# Patient Record
Sex: Male | Born: 1970 | Race: White | Hispanic: No | Marital: Married | Smoking: Never smoker
Health system: Southern US, Community
[De-identification: ages and names within clinical notes are randomized; demographics above are authoritative.]

## PROBLEM LIST (undated history)

## (undated) DIAGNOSIS — E785 Hyperlipidemia, unspecified: Secondary | ICD-10-CM

## (undated) DIAGNOSIS — K219 Gastro-esophageal reflux disease without esophagitis: Secondary | ICD-10-CM

## (undated) DIAGNOSIS — T7840XA Allergy, unspecified, initial encounter: Secondary | ICD-10-CM

## (undated) DIAGNOSIS — I10 Essential (primary) hypertension: Secondary | ICD-10-CM

## (undated) DIAGNOSIS — R51 Headache: Secondary | ICD-10-CM

## (undated) HISTORY — DX: Headache: R51

## (undated) HISTORY — DX: Hyperlipidemia, unspecified: E78.5

## (undated) HISTORY — DX: Essential (primary) hypertension: I10

## (undated) HISTORY — DX: Allergy, unspecified, initial encounter: T78.40XA

## (undated) HISTORY — DX: Gastro-esophageal reflux disease without esophagitis: K21.9

---

## 2000-01-23 ENCOUNTER — Encounter: Payer: Self-pay | Admitting: Internal Medicine

## 2000-01-23 ENCOUNTER — Ambulatory Visit (HOSPITAL_COMMUNITY): Admission: RE | Admit: 2000-01-23 | Discharge: 2000-01-23 | Payer: Self-pay | Admitting: Internal Medicine

## 2004-03-17 ENCOUNTER — Ambulatory Visit: Payer: Self-pay | Admitting: Internal Medicine

## 2004-04-10 ENCOUNTER — Ambulatory Visit: Payer: Self-pay | Admitting: Internal Medicine

## 2004-05-11 ENCOUNTER — Ambulatory Visit: Payer: Self-pay | Admitting: Internal Medicine

## 2004-07-15 ENCOUNTER — Ambulatory Visit: Payer: Self-pay | Admitting: Internal Medicine

## 2004-08-10 ENCOUNTER — Ambulatory Visit: Payer: Self-pay | Admitting: Internal Medicine

## 2004-08-26 ENCOUNTER — Ambulatory Visit: Payer: Self-pay | Admitting: Family Medicine

## 2004-10-14 ENCOUNTER — Ambulatory Visit: Payer: Self-pay | Admitting: Internal Medicine

## 2004-12-12 ENCOUNTER — Emergency Department (HOSPITAL_COMMUNITY): Admission: EM | Admit: 2004-12-12 | Discharge: 2004-12-12 | Payer: Self-pay | Admitting: Emergency Medicine

## 2004-12-14 ENCOUNTER — Ambulatory Visit: Payer: Self-pay | Admitting: Internal Medicine

## 2005-02-08 ENCOUNTER — Ambulatory Visit: Payer: Self-pay | Admitting: Internal Medicine

## 2005-04-12 ENCOUNTER — Ambulatory Visit: Payer: Self-pay | Admitting: Internal Medicine

## 2005-07-13 ENCOUNTER — Ambulatory Visit: Payer: Self-pay | Admitting: Internal Medicine

## 2005-10-12 ENCOUNTER — Ambulatory Visit: Payer: Self-pay | Admitting: Internal Medicine

## 2006-02-03 ENCOUNTER — Ambulatory Visit: Payer: Self-pay | Admitting: Internal Medicine

## 2006-02-16 ENCOUNTER — Ambulatory Visit: Payer: Self-pay | Admitting: Internal Medicine

## 2006-04-19 ENCOUNTER — Ambulatory Visit: Payer: Self-pay | Admitting: Internal Medicine

## 2006-07-13 ENCOUNTER — Encounter: Admission: RE | Admit: 2006-07-13 | Discharge: 2006-07-13 | Payer: Self-pay | Admitting: Internal Medicine

## 2006-11-17 DIAGNOSIS — R519 Headache, unspecified: Secondary | ICD-10-CM | POA: Insufficient documentation

## 2006-11-17 DIAGNOSIS — E785 Hyperlipidemia, unspecified: Secondary | ICD-10-CM | POA: Insufficient documentation

## 2006-11-17 DIAGNOSIS — R51 Headache: Secondary | ICD-10-CM | POA: Insufficient documentation

## 2006-11-17 DIAGNOSIS — I10 Essential (primary) hypertension: Secondary | ICD-10-CM | POA: Insufficient documentation

## 2006-11-17 DIAGNOSIS — K219 Gastro-esophageal reflux disease without esophagitis: Secondary | ICD-10-CM | POA: Insufficient documentation

## 2006-12-09 ENCOUNTER — Ambulatory Visit: Payer: Self-pay | Admitting: Internal Medicine

## 2006-12-09 DIAGNOSIS — J209 Acute bronchitis, unspecified: Secondary | ICD-10-CM | POA: Insufficient documentation

## 2007-05-08 ENCOUNTER — Ambulatory Visit: Payer: Self-pay | Admitting: Internal Medicine

## 2007-05-08 DIAGNOSIS — J0110 Acute frontal sinusitis, unspecified: Secondary | ICD-10-CM | POA: Insufficient documentation

## 2007-05-08 DIAGNOSIS — M549 Dorsalgia, unspecified: Secondary | ICD-10-CM | POA: Insufficient documentation

## 2007-07-27 ENCOUNTER — Ambulatory Visit: Payer: Self-pay | Admitting: Internal Medicine

## 2007-07-27 LAB — CONVERTED CEMR LAB
ALT: 50 units/L (ref 0–53)
AST: 35 units/L (ref 0–37)
Albumin: 4.2 g/dL (ref 3.5–5.2)
Alkaline Phosphatase: 60 units/L (ref 39–117)
BUN: 20 mg/dL (ref 6–23)
Basophils Absolute: 0 10*3/uL (ref 0.0–0.1)
Basophils Relative: 0.1 % (ref 0.0–1.0)
Bilirubin Urine: NEGATIVE
Bilirubin, Direct: 0.1 mg/dL (ref 0.0–0.3)
Blood in Urine, dipstick: NEGATIVE
CO2: 30 meq/L (ref 19–32)
Calcium: 9.8 mg/dL (ref 8.4–10.5)
Chloride: 104 meq/L (ref 96–112)
Cholesterol: 325 mg/dL (ref 0–200)
Creatinine, Ser: 1.1 mg/dL (ref 0.4–1.5)
Direct LDL: 236.1 mg/dL
Eosinophils Absolute: 0.1 10*3/uL (ref 0.0–0.7)
Eosinophils Relative: 2 % (ref 0.0–5.0)
GFR calc Af Amer: 97 mL/min
GFR calc non Af Amer: 81 mL/min
Glucose, Bld: 93 mg/dL (ref 70–99)
Glucose, Urine, Semiquant: NEGATIVE
HCT: 46.8 % (ref 39.0–52.0)
HDL: 38.7 mg/dL — ABNORMAL LOW (ref >39.0)
Hemoglobin: 15.8 g/dL (ref 13.0–17.0)
Ketones, urine, test strip: NEGATIVE
Lymphocytes Relative: 27.6 % (ref 12.0–46.0)
MCHC: 33.7 g/dL (ref 30.0–36.0)
MCV: 85.6 fL (ref 78.0–100.0)
Monocytes Absolute: 0.6 10*3/uL (ref 0.1–1.0)
Monocytes Relative: 13.1 % — ABNORMAL HIGH (ref 3.0–12.0)
Neutro Abs: 2.6 10*3/uL (ref 1.4–7.7)
Neutrophils Relative %: 57.2 % (ref 43.0–77.0)
Nitrite: NEGATIVE
Platelets: 224 10*3/uL (ref 150–400)
Potassium: 4.5 meq/L (ref 3.5–5.1)
RBC: 5.47 M/uL (ref 4.22–5.81)
RDW: 12.3 % (ref 11.5–14.6)
Sodium: 139 meq/L (ref 135–145)
Specific Gravity, Urine: 1.015
TSH: 1.27 microintl units/mL (ref 0.35–5.50)
Total Bilirubin: 1 mg/dL (ref 0.3–1.2)
Total CHOL/HDL Ratio: 8.4
Total Protein: 7.8 g/dL (ref 6.0–8.3)
Triglycerides: 235 mg/dL (ref 0–149)
Urobilinogen, UA: 0.2
VLDL: 47 mg/dL — ABNORMAL HIGH (ref 0–40)
WBC Urine, dipstick: NEGATIVE
WBC: 4.5 10*3/uL (ref 4.5–10.5)
pH: 6

## 2007-08-07 ENCOUNTER — Ambulatory Visit: Payer: Self-pay | Admitting: Internal Medicine

## 2007-10-19 ENCOUNTER — Ambulatory Visit: Payer: Self-pay | Admitting: Internal Medicine

## 2007-11-06 ENCOUNTER — Ambulatory Visit: Payer: Self-pay | Admitting: Internal Medicine

## 2007-11-09 LAB — CONVERTED CEMR LAB
ALT: 58 units/L — ABNORMAL HIGH (ref 0–53)
AST: 31 units/L (ref 0–37)
Albumin: 4.2 g/dL (ref 3.5–5.2)
Alkaline Phosphatase: 57 units/L (ref 39–117)
Bilirubin, Direct: 0.1 mg/dL (ref 0.0–0.3)
Cholesterol: 257 mg/dL (ref 0–200)
Direct LDL: 183.6 mg/dL
HDL: 40.3 mg/dL (ref >39.0)
Total Bilirubin: 0.8 mg/dL (ref 0.3–1.2)
Total CHOL/HDL Ratio: 6.4
Total Protein: 7.3 g/dL (ref 6.0–8.3)
Triglycerides: 189 mg/dL — ABNORMAL HIGH (ref 0–149)
VLDL: 38 mg/dL (ref 0–40)

## 2008-02-25 ENCOUNTER — Emergency Department (HOSPITAL_BASED_OUTPATIENT_CLINIC_OR_DEPARTMENT_OTHER): Admission: EM | Admit: 2008-02-25 | Discharge: 2008-02-25 | Payer: Self-pay | Admitting: Emergency Medicine

## 2009-01-28 ENCOUNTER — Telehealth: Payer: Self-pay | Admitting: *Deleted

## 2009-01-29 ENCOUNTER — Ambulatory Visit: Payer: Self-pay | Admitting: Internal Medicine

## 2009-01-29 DIAGNOSIS — M1710 Unilateral primary osteoarthritis, unspecified knee: Secondary | ICD-10-CM | POA: Insufficient documentation

## 2009-05-21 ENCOUNTER — Telehealth: Payer: Self-pay | Admitting: Internal Medicine

## 2010-05-07 NOTE — Progress Notes (Signed)
  Phone Note Call from Patient   Summary of Call: pt called c/o flu like signs andsince last night- daughter test positive for flu today- chils,fever, myalgia- per dr Lovell Sheehan tamiflu 75 two times a day for 7 days and teraflu otc- pt informed and  meds sent in Initial call taken by: Willy Eddy, LPN,  May 21, 2009 1:22 PM    New/Updated Medications: TAMIFLU 75 MG CAPS (OSELTAMIVIR PHOSPHATE) 1 two times a day for 7 days Prescriptions: TAMIFLU 75 MG CAPS (OSELTAMIVIR PHOSPHATE) 1 two times a day for 7 days  #14 x 0   Entered by:   Willy Eddy, LPN   Authorized by:   Stacie Glaze MD   Signed by:   Willy Eddy, LPN on 81/19/1478   Method used:   Electronically to        [facility]  [place] [address] (retail)       [address] [place] [address]       [place], [place]  [address]       Ph: [phone] or [phone]       Fax: [phone]   RxID:   [id number]

## 2010-06-24 ENCOUNTER — Other Ambulatory Visit: Payer: Self-pay | Admitting: *Deleted

## 2010-06-24 MED ORDER — ATORVASTATIN CALCIUM 20 MG PO TABS: 20.0000 mg | ORAL_TABLET | Freq: Every day | ORAL | Status: DC

## 2010-10-09 ENCOUNTER — Ambulatory Visit (INDEPENDENT_AMBULATORY_CARE_PROVIDER_SITE_OTHER): Payer: 59 | Admitting: Internal Medicine

## 2010-10-09 ENCOUNTER — Encounter: Payer: Self-pay | Admitting: Internal Medicine

## 2010-10-09 VITALS — BP 140/90 | HR 84 | Temp 98.2°F | Resp 16 | Ht 70.0 in | Wt 214.0 lb

## 2010-10-09 DIAGNOSIS — L578 Other skin changes due to chronic exposure to nonionizing radiation: Secondary | ICD-10-CM

## 2010-10-09 DIAGNOSIS — L570 Actinic keratosis: Secondary | ICD-10-CM

## 2010-10-09 NOTE — Progress Notes (Signed)
  Subjective:    Patient ID: Michael Hays, male    DOB: [birth year], 40 y.o.   MRN: [id number]  HPI  Patient presents with a sore raise area on his scalp that appears to be an actinic keratoses  Review of Systems  Constitutional: Negative for fever and fatigue.  HENT: Negative for hearing loss, congestion, neck pain and postnasal drip.   Eyes: Negative for discharge, redness and visual disturbance.  Respiratory: Negative for cough, shortness of breath and wheezing.   Cardiovascular: Negative for leg swelling.  Gastrointestinal: Negative for abdominal pain, constipation and abdominal distention.  Genitourinary: Negative for urgency and frequency.  Musculoskeletal: Negative for joint swelling and arthralgias.  Skin: Negative for color change and rash.  Neurological: Negative for weakness and light-headedness.  Hematological: Negative for adenopathy.  Psychiatric/Behavioral: Negative for behavioral problems.       Objective:   Physical Exam  Constitutional: He appears well-developed and well-nourished.  HENT:  Head: Normocephalic and atraumatic.  Eyes: Conjunctivae are normal. Pupils are equal, round, and reactive to light.  Neck: Normal range of motion. Neck supple.  Cardiovascular: Normal rate and regular rhythm.   Pulmonary/Chest: Effort normal and breath sounds normal.  Abdominal: Soft. Bowel sounds are normal.  Skin:       3 cm actinic keratoses          Assessment & Plan:   Informed consent was obtained in the lesion was treated for 60 seconds of liquid nitrogen application the patient tolerated the procedure well as procedural care was discussed with the patient and instructions should the lesion reappears contact our office immediately

## 2010-10-09 NOTE — Patient Instructions (Signed)
The site we treated with cryotherapy  with scab over and heal

## 2010-12-17 ENCOUNTER — Other Ambulatory Visit: Payer: Self-pay | Admitting: Internal Medicine

## 2011-01-06 LAB — BASIC METABOLIC PANEL
CO2: 26
Calcium: 10.1
Creatinine, Ser: 1.2
GFR calc Af Amer: >60
GFR calc non Af Amer: >60
Glucose, Bld: 115 — ABNORMAL HIGH
Sodium: 139

## 2011-01-06 LAB — URINALYSIS, ROUTINE W REFLEX MICROSCOPIC
Bilirubin Urine: NEGATIVE
Nitrite: NEGATIVE
Specific Gravity, Urine: 1.021
pH: 6

## 2011-01-06 LAB — URINE MICROSCOPIC-ADD ON

## 2011-01-08 ENCOUNTER — Other Ambulatory Visit (INDEPENDENT_AMBULATORY_CARE_PROVIDER_SITE_OTHER): Payer: 59

## 2011-01-08 DIAGNOSIS — Z0000 Encounter for general adult medical examination without abnormal findings: Secondary | ICD-10-CM

## 2011-01-08 LAB — CBC WITH DIFFERENTIAL/PLATELET
Basophils Relative: 0.6 % (ref 0.0–3.0)
Eosinophils Relative: 1.7 % (ref 0.0–5.0)
Lymphocytes Relative: 21.5 % (ref 12.0–46.0)
Monocytes Absolute: 0.8 10*3/uL (ref 0.1–1.0)
Monocytes Relative: 12.5 % — ABNORMAL HIGH (ref 3.0–12.0)
Neutrophils Relative %: 63.7 % (ref 43.0–77.0)
Platelets: 212 10*3/uL (ref 150.0–400.0)
RBC: 5.09 Mil/uL (ref 4.22–5.81)
WBC: 6.1 10*3/uL (ref 4.5–10.5)

## 2011-01-08 LAB — HEPATIC FUNCTION PANEL
ALT: 51 U/L (ref 0–53)
AST: 32 U/L (ref 0–37)
Alkaline Phosphatase: 67 U/L (ref 39–117)
Bilirubin, Direct: 0 mg/dL (ref 0.0–0.3)
Total Bilirubin: 0.5 mg/dL (ref 0.3–1.2)
Total Protein: 8 g/dL (ref 6.0–8.3)

## 2011-01-08 LAB — BASIC METABOLIC PANEL
BUN: 17 mg/dL (ref 6–23)
Calcium: 9.6 mg/dL (ref 8.4–10.5)
Chloride: 103 mEq/L (ref 96–112)
Creatinine, Ser: 1 mg/dL (ref 0.4–1.5)
GFR: 86.89 mL/min (ref >60.00)

## 2011-01-08 LAB — POCT URINALYSIS DIPSTICK
Bilirubin, UA: NEGATIVE
Leukocytes, UA: NEGATIVE
Nitrite, UA: NEGATIVE
pH, UA: 6

## 2011-01-08 LAB — TSH: TSH: 1.36 u[IU]/mL (ref 0.35–5.50)

## 2011-01-08 LAB — LIPID PANEL
Total CHOL/HDL Ratio: 5
Triglycerides: 300 mg/dL — ABNORMAL HIGH (ref 0.0–149.0)

## 2011-01-15 ENCOUNTER — Encounter: Payer: Self-pay | Admitting: Internal Medicine

## 2011-01-15 ENCOUNTER — Ambulatory Visit (INDEPENDENT_AMBULATORY_CARE_PROVIDER_SITE_OTHER): Payer: 59 | Admitting: Internal Medicine

## 2011-01-15 VITALS — BP 180/120 | HR 80 | Temp 98.2°F | Resp 16 | Ht 70.0 in | Wt 216.0 lb

## 2011-01-15 DIAGNOSIS — E785 Hyperlipidemia, unspecified: Secondary | ICD-10-CM

## 2011-01-15 DIAGNOSIS — T887XXA Unspecified adverse effect of drug or medicament, initial encounter: Secondary | ICD-10-CM

## 2011-01-15 DIAGNOSIS — Z0000 Encounter for general adult medical examination without abnormal findings: Secondary | ICD-10-CM

## 2011-01-15 NOTE — Progress Notes (Signed)
  Subjective:    Patient ID: Michael Hays, male    DOB: [birth year], 40 y.o.   MRN: [id number]  HPI Ate a high salt meal last night blood pressure elevated He has gained weight and does not do aerobic exercise.  His use of beer is probably over our recommended amount which influences both his hypertension and his hyperlipidemia.  He has significant cardiovascular risk on both sides of his family     Review of Systems  Constitutional: Negative for fever and fatigue.  HENT: Negative for hearing loss, congestion, neck pain and postnasal drip.   Eyes: Negative for discharge, redness and visual disturbance.  Respiratory: Negative for cough, shortness of breath and wheezing.   Cardiovascular: Negative for leg swelling.  Gastrointestinal: Negative for abdominal pain, constipation and abdominal distention.  Genitourinary: Negative for urgency and frequency.  Musculoskeletal: Negative for joint swelling and arthralgias.  Skin: Negative for color change and rash.  Neurological: Negative for weakness and light-headedness.  Hematological: Negative for adenopathy.  Psychiatric/Behavioral: Negative for behavioral problems.   Past Medical History  Diagnosis Date  . Hyperlipidemia   . Hypertension   . GERD (gastroesophageal reflux disease)   . Allergy   . Headache    No past surgical history on file.  reports that he has never smoked. His smokeless tobacco use includes Chew. He reports that he drinks about 3.5 ounces of alcohol per week. His drug history not on file. family history includes Coronary artery disease in an unspecified family member; Diabetes in his mother; and Early death (age of onset:61) in his mother. Allergies  Allergen Reactions  . Atenolol   . Codeine   . Hydrocodone-Acetaminophen         Objective:   Physical Exam  Nursing note and vitals reviewed. Constitutional: He is oriented to person, place, and time. He appears well-developed and well-nourished.  HENT:    Head: Normocephalic and atraumatic.  Eyes: Conjunctivae are normal. Pupils are equal, round, and reactive to light.  Neck: Normal range of motion. Neck supple.  Cardiovascular: Normal rate and regular rhythm.   Pulmonary/Chest: Effort normal and breath sounds normal.  Abdominal: Soft. Bowel sounds are normal.  Musculoskeletal: Normal range of motion.  Neurological: He is alert and oriented to person, place, and time.  Skin: Skin is warm and dry.          Assessment & Plan:   Patient presents for yearly preventative medicine examination.   all immunizations and health maintenance protocols were reviewed with the patient and they are up to date with these protocols.   screening laboratory values were reviewed with the patient including screening of hyperlipidemia PSA renal function and hepatic function.   There medications past medical history social history problem list and allergies were reviewed in detail.   Goals were established with regard to weight loss exercise diet in compliance with medications  Weight loss compliance with medications moderation of diet are all she's to sustaining his health current medications will be maintained with a goal of 20 pound weight loss and that aerobic exercise program

## 2011-01-15 NOTE — Patient Instructions (Signed)
Under 200 pounds weight is goal

## 2011-06-21 ENCOUNTER — Other Ambulatory Visit: Payer: Self-pay | Admitting: Internal Medicine

## 2011-07-06 ENCOUNTER — Other Ambulatory Visit: Payer: Self-pay | Admitting: *Deleted

## 2011-07-06 MED ORDER — AZITHROMYCIN 250 MG PO TABS: 250.0000 mg | ORAL_TABLET | Freq: Every day | ORAL | Status: AC

## 2011-07-16 ENCOUNTER — Other Ambulatory Visit: Payer: 59

## 2011-07-23 ENCOUNTER — Ambulatory Visit: Payer: 59 | Admitting: Internal Medicine

## 2011-07-26 ENCOUNTER — Other Ambulatory Visit (INDEPENDENT_AMBULATORY_CARE_PROVIDER_SITE_OTHER): Payer: 59

## 2011-07-26 DIAGNOSIS — T887XXA Unspecified adverse effect of drug or medicament, initial encounter: Secondary | ICD-10-CM

## 2011-07-26 DIAGNOSIS — E785 Hyperlipidemia, unspecified: Secondary | ICD-10-CM

## 2011-07-26 LAB — LIPID PANEL
Cholesterol: 259 mg/dL — ABNORMAL HIGH (ref 0–200)
Total CHOL/HDL Ratio: 4

## 2011-07-26 LAB — HEPATIC FUNCTION PANEL
ALT: 45 U/L (ref 0–53)
AST: 30 U/L (ref 0–37)
Albumin: 4.6 g/dL (ref 3.5–5.2)
Alkaline Phosphatase: 58 U/L (ref 39–117)
Total Bilirubin: 0.7 mg/dL (ref 0.3–1.2)

## 2011-07-30 ENCOUNTER — Ambulatory Visit (INDEPENDENT_AMBULATORY_CARE_PROVIDER_SITE_OTHER): Payer: 59 | Admitting: Internal Medicine

## 2011-07-30 ENCOUNTER — Encounter: Payer: Self-pay | Admitting: Internal Medicine

## 2011-07-30 VITALS — BP 160/90 | HR 80 | Temp 98.2°F | Resp 16 | Ht 70.0 in | Wt 209.0 lb

## 2011-07-30 DIAGNOSIS — E785 Hyperlipidemia, unspecified: Secondary | ICD-10-CM

## 2011-07-30 DIAGNOSIS — I10 Essential (primary) hypertension: Secondary | ICD-10-CM

## 2011-07-30 MED ORDER — ATORVASTATIN CALCIUM 40 MG PO TABS: 40.0000 mg | ORAL_TABLET | Freq: Every day | ORAL | Status: DC

## 2011-07-30 MED ORDER — HYDROCHLOROTHIAZIDE 12.5 MG PO TABS: 12.5000 mg | ORAL_TABLET | Freq: Every day | ORAL | Status: DC

## 2011-07-30 NOTE — Progress Notes (Signed)
Subjective:    Patient ID: Michael Hays, male    DOB: 1970-05-23, 41 y.o.   MRN: [id number]  HPI Increased pressure and stress. Also increased beer. Blood pressure increased at home 150/80 Pt states that sometime blood pressure is lower such as on a Sunday morning it is 107/80 Also timing of medications plays a role Only on Nadolol The nadolol helps the head aches Discussion of the used of a diuretic in the day Lipid monitoring     Review of Systems  Constitutional: Negative for fever and fatigue.  HENT: Negative for hearing loss, congestion, neck pain and postnasal drip.   Eyes: Negative for discharge, redness and visual disturbance.  Respiratory: Negative for cough, shortness of breath and wheezing.   Cardiovascular: Negative for leg swelling.  Gastrointestinal: Negative for abdominal pain, constipation and abdominal distention.  Genitourinary: Negative for urgency and frequency.  Musculoskeletal: Negative for joint swelling and arthralgias.  Skin: Negative for color change and rash.  Neurological: Negative for weakness and light-headedness.  Hematological: Negative for adenopathy.  Psychiatric/Behavioral: Negative for behavioral problems.   Past Medical History  Diagnosis Date  . Hyperlipidemia   . Hypertension   . GERD (gastroesophageal reflux disease)   . Allergy   . Headache     History   Social History  . Marital Status: Married    Spouse Name: N/A    Number of Children: N/A  . Years of Education: N/A   Occupational History  . carpenter    Social History Main Topics  . Smoking status: Never Smoker   . Smokeless tobacco: Current User    Types: Chew  . Alcohol Use: 3.5 oz/week    7 drink(s) per week  . Drug Use: Not on file  . Sexually Active: Yes   Other Topics Concern  . Not on file   Social History Narrative  . No narrative on file    No past surgical history on file.  Family History  Problem Relation Age of Onset  . Coronary artery  disease    . Diabetes Mother   . Early death Mother 2    Allergies  Allergen Reactions  . Atenolol   . Codeine   . Hydrocodone-Acetaminophen     Current Outpatient Prescriptions on File Prior to Visit  Medication Sig Dispense Refill  . aspirin 325 MG tablet Take 325 mg by mouth daily.        . fexofenadine (ALLEGRA) 180 MG tablet Take 180 mg by mouth daily as needed.        . multivitamin (THERAGRAN) per tablet Take 1 tablet by mouth daily.        . nadolol (CORGARD) 40 MG tablet TAKE ONE TABLET BY MOUTH EVERY DAY  60 tablet  6  . DISCONTD: atorvastatin (LIPITOR) 20 MG tablet Take 1 tablet (20 mg total) by mouth daily.  30 tablet  11    BP 160/90  Pulse 80  Temp 98.2 F (36.8 C)  Resp 16  Ht 5\' 10"  (1.778 m)  Wt 209 lb (94.802 kg)  BMI 29.99 kg/m2       Objective:   Physical Exam  Nursing note and vitals reviewed. Constitutional: He appears well-developed and well-nourished.  HENT:  Head: Normocephalic and atraumatic.  Eyes: Conjunctivae are normal. Pupils are equal, round, and reactive to light.  Neck: Normal range of motion. Neck supple.  Cardiovascular: Normal rate and regular rhythm.   Pulmonary/Chest: Effort normal and breath sounds normal.  Abdominal: Soft. Bowel  sounds are normal.          Assessment & Plan:  Ate tacos and drank beer yesterday and this may attribute for the blood pressure increase. Add HCTZ in the AM. Discussed increasing the Lipitor to 40 daily Weight loss noted and goals set.

## 2011-07-30 NOTE — Patient Instructions (Signed)
The patient is instructed to continue all medications as prescribed. Schedule followup with check out clerk upon leaving the clinic  

## 2012-01-27 ENCOUNTER — Other Ambulatory Visit: Payer: Self-pay | Admitting: *Deleted

## 2012-01-27 MED ORDER — AZITHROMYCIN 250 MG PO TABS: ORAL_TABLET | ORAL | Status: DC

## 2012-03-10 ENCOUNTER — Other Ambulatory Visit: Payer: Self-pay | Admitting: *Deleted

## 2012-03-10 MED ORDER — INDOMETHACIN 25 MG PO CAPS: 25.0000 mg | ORAL_CAPSULE | Freq: Three times a day (TID) | ORAL | Status: DC | PRN

## 2012-03-14 ENCOUNTER — Other Ambulatory Visit: Payer: Self-pay | Admitting: *Deleted

## 2012-03-14 MED ORDER — METAXALONE 800 MG PO TABS: 800.0000 mg | ORAL_TABLET | Freq: Three times a day (TID) | ORAL | Status: DC

## 2012-07-07 ENCOUNTER — Encounter: Payer: Self-pay | Admitting: Internal Medicine

## 2012-07-07 ENCOUNTER — Ambulatory Visit (INDEPENDENT_AMBULATORY_CARE_PROVIDER_SITE_OTHER): Payer: 59 | Admitting: Internal Medicine

## 2012-07-07 VITALS — BP 155/88 | HR 84 | Temp 98.2°F | Resp 16 | Ht 70.0 in | Wt 209.0 lb

## 2012-07-07 DIAGNOSIS — M109 Gout, unspecified: Secondary | ICD-10-CM

## 2012-07-07 DIAGNOSIS — M10071 Idiopathic gout, right ankle and foot: Secondary | ICD-10-CM

## 2012-07-07 DIAGNOSIS — I10 Essential (primary) hypertension: Secondary | ICD-10-CM

## 2012-07-07 MED ORDER — METHYLPREDNISOLONE ACETATE 40 MG/ML IJ SUSP: 20.0000 mg | Freq: Once | INTRAMUSCULAR | Status: DC

## 2012-07-07 NOTE — Progress Notes (Signed)
  Subjective:    Patient ID: Michael Hays, male    DOB: 05-27-1970, 42 y.o.   MRN: [id number]  HPI Presents for pain in his right great toe consistent with a gouty attack.  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 Local anesthesia was obtained with topical spray. Then 20 mg of Depo-Medrol and 1/4 cc of lidocaine was injected into the joint space. The patient tolerated the procedure without complications. Post injection care discussed with patient.

## 2012-07-08 ENCOUNTER — Encounter (HOSPITAL_BASED_OUTPATIENT_CLINIC_OR_DEPARTMENT_OTHER): Payer: Self-pay | Admitting: *Deleted

## 2012-07-08 ENCOUNTER — Emergency Department (HOSPITAL_BASED_OUTPATIENT_CLINIC_OR_DEPARTMENT_OTHER)
Admission: EM | Admit: 2012-07-08 | Discharge: 2012-07-08 | Disposition: A | Discharge status: Home / Self Care | Payer: 59 | Attending: Emergency Medicine | Admitting: Emergency Medicine

## 2012-07-08 ENCOUNTER — Emergency Department (HOSPITAL_BASED_OUTPATIENT_CLINIC_OR_DEPARTMENT_OTHER): Payer: 59

## 2012-07-08 DIAGNOSIS — I10 Essential (primary) hypertension: Secondary | ICD-10-CM | POA: Insufficient documentation

## 2012-07-08 DIAGNOSIS — IMO0002 Reserved for concepts with insufficient information to code with codable children: Secondary | ICD-10-CM

## 2012-07-08 DIAGNOSIS — Z7982 Long term (current) use of aspirin: Secondary | ICD-10-CM | POA: Insufficient documentation

## 2012-07-08 DIAGNOSIS — Y939 Activity, unspecified: Secondary | ICD-10-CM | POA: Insufficient documentation

## 2012-07-08 DIAGNOSIS — Z79899 Other long term (current) drug therapy: Secondary | ICD-10-CM | POA: Insufficient documentation

## 2012-07-08 DIAGNOSIS — S61509A Unspecified open wound of unspecified wrist, initial encounter: Secondary | ICD-10-CM | POA: Insufficient documentation

## 2012-07-08 DIAGNOSIS — Z8719 Personal history of other diseases of the digestive system: Secondary | ICD-10-CM | POA: Insufficient documentation

## 2012-07-08 DIAGNOSIS — W268XXA Contact with other sharp object(s), not elsewhere classified, initial encounter: Secondary | ICD-10-CM | POA: Insufficient documentation

## 2012-07-08 DIAGNOSIS — Y929 Unspecified place or not applicable: Secondary | ICD-10-CM | POA: Insufficient documentation

## 2012-07-08 DIAGNOSIS — E785 Hyperlipidemia, unspecified: Secondary | ICD-10-CM | POA: Insufficient documentation

## 2012-07-08 MED ORDER — CEPHALEXIN 500 MG PO CAPS: 500.0000 mg | ORAL_CAPSULE | Freq: Four times a day (QID) | ORAL | Status: DC

## 2012-07-08 NOTE — ED Provider Notes (Signed)
History     CSN: [id number]  Arrival date & time 07/08/12  1539   First MD Initiated Contact with Patient 07/08/12 1653      Chief Complaint  Patient presents with  . Laceration    (Consider location/radiation/quality/duration/timing/severity/associated sxs/prior treatment) HPI Comments: Patient presenting with a wound to the right wrist.  He reports that just prior to arrival a drill bit punctured the volar aspect of the right wrist.  He has been applying pressure to the area.  Bleeding controlled at this time.  He has full ROM of the right wrist and all fingers of the right hand.  He denies numbness or tingling.  He reports that the pain is mild.  Tetanus is UTD.    The history is provided by the patient.    Past Medical History  Diagnosis Date  . Hyperlipidemia   . Hypertension   . GERD (gastroesophageal reflux disease)   . Allergy   . Headache     History reviewed. No pertinent past surgical history.  Family History  Problem Relation Age of Onset  . Coronary artery disease    . Diabetes Mother   . Early death Mother 41    History  Substance Use Topics  . Smoking status: Never Smoker   . Smokeless tobacco: Current User    Types: Chew  . Alcohol Use: 3.5 oz/week    7 drink(s) per week      Review of Systems  Musculoskeletal: Negative for joint swelling.  Skin: Positive for wound.  Neurological: Negative for numbness.    Allergies  Atenolol; Codeine; and Hydrocodone-acetaminophen  Home Medications   Current Outpatient Rx  Name  Route  Sig  Dispense  Refill  . rosuvastatin (CRESTOR) 20 MG tablet   Oral   Take 20 mg by mouth 2 (two) times a week.         Marland Kitchen aspirin 325 MG tablet   Oral   Take 325 mg by mouth daily.           Marland Kitchen atorvastatin (LIPITOR) 40 MG tablet   Oral   Take 1 tablet (40 mg total) by mouth daily.   90 tablet   3   . fexofenadine (ALLEGRA) 180 MG tablet   Oral   Take 180 mg by mouth daily as needed.           .  hydrochlorothiazide (HYDRODIURIL) 12.5 MG tablet   Oral   Take 1 tablet (12.5 mg total) by mouth daily.   90 tablet   3   . indomethacin (INDOCIN) 25 MG capsule   Oral   Take 1 capsule (25 mg total) by mouth 3 (three) times daily as needed.   30 capsule   1   . metaxalone (SKELAXIN) 800 MG tablet   Oral   Take 1 tablet (800 mg total) by mouth 3 (three) times daily.   30 tablet   1   . multivitamin (THERAGRAN) per tablet   Oral   Take 1 tablet by mouth daily.           . nadolol (CORGARD) 40 MG tablet      TAKE ONE TABLET BY MOUTH EVERY DAY   60 tablet   6     BP 165/103  Pulse 78  Temp(Src) 98.5 F (36.9 C) (Oral)  Resp 20  Ht 5\' 9"  (1.753 m)  Wt 209 lb (94.802 kg)  BMI 30.85 kg/m2  SpO2 95%  Physical Exam  Nursing note and  vitals reviewed. Constitutional: He appears well-developed and well-nourished. No distress.  HENT:  Head: Normocephalic and atraumatic.  Cardiovascular: Normal rate, regular rhythm and normal heart sounds.   Pulses:      Radial pulses are 2+ on the right side, and 2+ on the left side.  Pulmonary/Chest: Effort normal and breath sounds normal.  Musculoskeletal:       Right wrist: He exhibits normal range of motion, no swelling and no deformity.  Full ROM of the right wrist and all fingers of the right hand.  Neurological: He is alert. No sensory deficit.  Distal sensation of all fingers of the right hand intact.   Grip strength 5/5 bilaterally Muscle strength 5/5 with flexion and extension of the right wrist.  Skin: Skin is warm and dry. He is not diaphoretic.  0.5 cm x shaped puncture wound of the right wrist.  No active bleeding.  Wound is non gaping.    Psychiatric: He has a normal mood and affect.    ED Course  Procedures (including critical care time)  Labs Reviewed - No data to display Dg Wrist Complete Right  07/08/2012  *RADIOLOGY REPORT*  Clinical Data: Laceration of right wrist with drill bit.  RIGHT WRIST - COMPLETE 3+  VIEW  Comparison:  None.  Findings:  There is no evidence of fracture or dislocation.  There is no evidence of arthropathy or other focal bone abnormality. Soft tissues are unremarkable. No soft tissue foreign body is identified.  IMPRESSION: Negative.   Original Report Authenticated By: Irish Lack, M.D.      No diagnosis found.    MDM  Patient presenting with puncture wound of the volar aspect of the wrist.  Wound non gaping.  Do not feel that sutures are indicated.  Bleeding controlled.  Patient neurovascularly intact.  Full ROM of the wrist.  Tetanus UTD.  Patient stable for discharge.        Pascal Lux [place], PA-C 07/10/12 1107  Pascal Lux [place], PA-C 07/10/12 [id number]

## 2012-07-08 NOTE — ED Notes (Signed)
Pt states he stuck a drill bit in his right wrist and "thinks he got an artery"  Bleeding controlled with pressure dressing. PMS intact.

## 2012-07-10 NOTE — ED Provider Notes (Signed)
Medical screening examination/treatment/procedure(s) were performed by non-physician practitioner and as supervising physician I was immediately available for consultation/collaboration.   Charles B. Bernette Mayers, MD 07/10/12 2118

## 2012-07-13 ENCOUNTER — Other Ambulatory Visit: Payer: Self-pay | Admitting: Internal Medicine

## 2012-07-24 ENCOUNTER — Other Ambulatory Visit: Payer: Self-pay | Admitting: Internal Medicine

## 2012-09-11 ENCOUNTER — Other Ambulatory Visit: Payer: Self-pay | Admitting: *Deleted

## 2012-09-11 MED ORDER — AMLODIPINE BESYLATE 5 MG PO TABS: 5.0000 mg | ORAL_TABLET | Freq: Every day | ORAL | Status: DC

## 2012-09-11 MED ORDER — NADOLOL 40 MG PO TABS: ORAL_TABLET | ORAL | Status: DC

## 2013-01-24 ENCOUNTER — Ambulatory Visit (INDEPENDENT_AMBULATORY_CARE_PROVIDER_SITE_OTHER): Payer: 59 | Admitting: Internal Medicine

## 2013-01-24 ENCOUNTER — Encounter: Payer: Self-pay | Admitting: Internal Medicine

## 2013-01-24 VITALS — BP 178/100 | HR 74 | Temp 98.4°F | Wt 212.0 lb

## 2013-01-24 DIAGNOSIS — I10 Essential (primary) hypertension: Secondary | ICD-10-CM

## 2013-01-24 DIAGNOSIS — Z23 Encounter for immunization: Secondary | ICD-10-CM

## 2013-01-24 MED ORDER — TELMISARTAN-HCTZ 80-12.5 MG PO TABS: 0.5000 | ORAL_TABLET | Freq: Every morning | ORAL | Status: DC

## 2013-01-24 NOTE — Progress Notes (Signed)
Subjective:    Patient ID: Michael Hays, male    DOB: [birth year], 42 y.o.   MRN: [id number]  HPI  Atypical chest pain and HTN with strong family history of CAD and DM He relates the chest pain to the norvasc? anterior lower chest discomfort  Review of Systems  Constitutional: Positive for fatigue. Negative for fever.  HENT: Negative for congestion, hearing loss and postnasal drip.   Eyes: Negative for discharge, redness and visual disturbance.  Respiratory: Negative for cough, shortness of breath and wheezing.   Cardiovascular: Positive for chest pain. Negative for leg swelling.  Gastrointestinal: Negative for abdominal pain, constipation and abdominal distention.  Genitourinary: Negative for urgency and frequency.  Musculoskeletal: Negative for arthralgias, joint swelling and neck pain.  Skin: Negative for color change and rash.  Neurological: Positive for weakness. Negative for light-headedness.  Hematological: Negative for adenopathy.  Psychiatric/Behavioral: Negative for behavioral problems.   Past Medical History  Diagnosis Date  . Hyperlipidemia   . Hypertension   . GERD (gastroesophageal reflux disease)   . Allergy   . Headache     History   Social History  . Marital Status: Married    Spouse Name: N/A    Number of Children: N/A  . Years of Education: N/A   Occupational History  . carpenter    Social History Main Topics  . Smoking status: Never Smoker   . Smokeless tobacco: Current User    Types: Chew  . Alcohol Use: 3.5 oz/week    7 drink(s) per week  . Drug Use: Not on file  . Sexual Activity: Yes   Other Topics Concern  . Not on file   Social History Narrative  . No narrative on file    No past surgical history on file.  Family History  Problem Relation Age of Onset  . Coronary artery disease    . Diabetes Mother   . Early death Mother 28    Allergies  Allergen Reactions  . Atenolol   . Codeine   . Hydrocodone-Acetaminophen      Current Outpatient Prescriptions on File Prior to Visit  Medication Sig Dispense Refill  . amLODipine (NORVASC) 5 MG tablet Take 1 tablet (5 mg total) by mouth daily.  90 tablet  3  . aspirin 325 MG tablet Take 325 mg by mouth daily.        Marland Kitchen atorvastatin (LIPITOR) 40 MG tablet Take 1 tablet (40 mg total) by mouth daily.  90 tablet  3  . cephALEXin (KEFLEX) 500 MG capsule Take 1 capsule (500 mg total) by mouth 4 (four) times daily.  28 capsule  0  . fexofenadine (ALLEGRA) 180 MG tablet Take 180 mg by mouth daily as needed.        . indomethacin (INDOCIN) 25 MG capsule Take 1 capsule (25 mg total) by mouth 3 (three) times daily as needed.  30 capsule  1  . metaxalone (SKELAXIN) 800 MG tablet Take 1 tablet (800 mg total) by mouth 3 (three) times daily.  30 tablet  1  . multivitamin (THERAGRAN) per tablet Take 1 tablet by mouth daily.        . nadolol (CORGARD) 40 MG tablet TAKE ONE TABLET BY MOUTH EVERY DAY  90 tablet  3  . rosuvastatin (CRESTOR) 20 MG tablet Take 20 mg by mouth 2 (two) times a week.       No current facility-administered medications on file prior to visit.    There were no vitals  taken for this visit.       Objective:   Physical Exam  Nursing note and vitals reviewed. Constitutional: He appears well-developed and well-nourished.  HENT:  Head: Normocephalic and atraumatic.  Eyes: Conjunctivae are normal. Pupils are equal, round, and reactive to light.  Neck: Normal range of motion. Neck supple.  Cardiovascular: Normal rate and regular rhythm.   Pulmonary/Chest: Effort normal and breath sounds normal. He exhibits tenderness.  Abdominal: Soft. Bowel sounds are normal.  Skin: Skin is warm and dry.    Moderately obese      Assessment & Plan:  Weight loss for abdominal distension and gerd HTN poorly controlled and unable to tolerate the norvasc so trial of 40/6.24 of the micardis

## 2013-04-16 ENCOUNTER — Other Ambulatory Visit: Payer: Self-pay | Admitting: *Deleted

## 2013-04-16 ENCOUNTER — Other Ambulatory Visit: Payer: Self-pay | Admitting: Internal Medicine

## 2013-06-28 ENCOUNTER — Other Ambulatory Visit: Payer: Self-pay

## 2013-06-28 DIAGNOSIS — I10 Essential (primary) hypertension: Secondary | ICD-10-CM

## 2013-06-28 MED ORDER — TELMISARTAN-HCTZ 80-12.5 MG PO TABS: 0.5000 | ORAL_TABLET | Freq: Every morning | ORAL | Status: DC

## 2013-06-28 MED ORDER — TELMISARTAN-HCTZ 80-12.5 MG PO TABS: 0.5000 | ORAL_TABLET | Freq: Every day | ORAL | Status: DC

## 2013-06-28 NOTE — Telephone Encounter (Signed)
Ok per Dr. Arnoldo Morale for pt to have micardis 80-12.5 mg sent locally to [facility] in [place] #30 with 1 rf then send #90 with 3 rf to [place].  Prescriptions sent.

## 2013-06-29 ENCOUNTER — Other Ambulatory Visit: Payer: Self-pay | Admitting: *Deleted

## 2013-06-29 DIAGNOSIS — I10 Essential (primary) hypertension: Secondary | ICD-10-CM

## 2013-06-29 MED ORDER — TELMISARTAN-HCTZ 80-12.5 MG PO TABS: 0.5000 | ORAL_TABLET | Freq: Every day | ORAL | Status: DC

## 2013-06-29 MED ORDER — NADOLOL 40 MG PO TABS: ORAL_TABLET | ORAL | Status: DC

## 2013-07-04 ENCOUNTER — Other Ambulatory Visit: Payer: Self-pay

## 2013-07-04 DIAGNOSIS — I10 Essential (primary) hypertension: Secondary | ICD-10-CM

## 2013-07-04 MED ORDER — TELMISARTAN-HCTZ 80-12.5 MG PO TABS: 0.5000 | ORAL_TABLET | Freq: Every day | ORAL | Status: DC

## 2013-07-04 NOTE — Telephone Encounter (Signed)
telmisartan increased to a 3 month supply per insurance request

## 2013-08-14 ENCOUNTER — Other Ambulatory Visit: Payer: Self-pay

## 2013-08-14 MED ORDER — LEVOFLOXACIN 500 MG PO TABS: 500.0000 mg | ORAL_TABLET | Freq: Every day | ORAL | Status: DC

## 2013-10-02 ENCOUNTER — Other Ambulatory Visit: Payer: Self-pay | Admitting: *Deleted

## 2013-10-02 MED ORDER — INDOMETHACIN 25 MG PO CAPS: ORAL_CAPSULE | ORAL | Status: DC

## 2013-10-30 ENCOUNTER — Telehealth: Payer: Self-pay | Admitting: Internal Medicine

## 2013-10-30 MED ORDER — COLCHICINE 0.6 MG PO TABS: 0.6000 mg | ORAL_TABLET | Freq: Two times a day (BID) | ORAL | Status: DC | PRN

## 2013-10-30 MED ORDER — METHYLPREDNISOLONE (PAK) 4 MG PO TABS: ORAL_TABLET | ORAL | Status: DC

## 2013-10-30 NOTE — Telephone Encounter (Signed)
rx sent in electronically 

## 2013-10-30 NOTE — Telephone Encounter (Signed)
Per dr Arnoldo Morale, call in Colchicine  0.6 mg tablet: #30 1 bid prn refills 6 AND Medrol dose pak 72ml  directed [employer]

## 2014-02-07 ENCOUNTER — Other Ambulatory Visit: Payer: Self-pay | Admitting: *Deleted

## 2014-02-07 MED ORDER — ATORVASTATIN CALCIUM 10 MG PO TABS: 10.0000 mg | ORAL_TABLET | Freq: Every day | ORAL | Status: DC

## 2014-03-04 ENCOUNTER — Other Ambulatory Visit: Payer: Self-pay | Admitting: *Deleted

## 2014-03-04 MED ORDER — FENOFIBRATE 145 MG PO TABS: 145.0000 mg | ORAL_TABLET | Freq: Every day | ORAL | Status: DC

## 2014-04-04 ENCOUNTER — Other Ambulatory Visit: Payer: Self-pay

## 2014-04-04 MED ORDER — EZETIMIBE-SIMVASTATIN 10-20 MG PO TABS: ORAL_TABLET | ORAL | Status: DC

## 2014-04-04 NOTE — Telephone Encounter (Signed)
Received a call from Dr. Arnoldo Morale to fill Vytorin 10-20- Take 1 po every other day. #15 6 rf.  Rx sent to pharmacy. Called and spoke with pt and pt is aware.  Rx sent to [facility] in [place].

## 2014-08-07 ENCOUNTER — Telehealth: Payer: Self-pay

## 2014-08-07 NOTE — Telephone Encounter (Signed)
Error

## 2014-08-14 ENCOUNTER — Telehealth: Payer: Self-pay | Admitting: *Deleted

## 2014-08-14 ENCOUNTER — Telehealth: Payer: Self-pay | Admitting: Internal Medicine

## 2014-08-14 DIAGNOSIS — I10 Essential (primary) hypertension: Secondary | ICD-10-CM

## 2014-08-14 MED ORDER — TELMISARTAN-HCTZ 80-12.5 MG PO TABS: 0.5000 | ORAL_TABLET | Freq: Every day | ORAL | Status: DC

## 2014-08-14 MED ORDER — EZETIMIBE-SIMVASTATIN 10-20 MG PO TABS: ORAL_TABLET | ORAL | Status: DC

## 2014-08-14 MED ORDER — NADOLOL 40 MG PO TABS: ORAL_TABLET | ORAL | Status: DC

## 2014-08-14 NOTE — Telephone Encounter (Signed)
Pt said he would pay out of pocket for the nadolol.  Left message on [facility]'s voicemail informing to fill it for pt

## 2014-08-14 NOTE — Telephone Encounter (Signed)
Peter Congo called from the [facility] and said she could not fill the rx that was sent over for the patient. She said she tried to fill and was told by his insurance company that the rx need to go thru mail order

## 2014-08-14 NOTE — Telephone Encounter (Signed)
Dr Arnoldo Morale called me this morning and asked me to send in pts Blood pressure and cholesterol medication to his mail order pharmacy with 3 refills and a 30 day supply to [facility] in [place].  I called pt to confirm what medications he was needing.  He stated he needed Vytorin, Nadolol, and Micardis.  30 day supply with no refills sent in to [facility] and 90 days sent in to [facility]

## 2014-08-22 ENCOUNTER — Ambulatory Visit: Payer: 59 | Admitting: Internal Medicine

## 2014-12-10 ENCOUNTER — Other Ambulatory Visit: Payer: Self-pay | Admitting: Internal Medicine

## 2014-12-10 DIAGNOSIS — M25562 Pain in left knee: Secondary | ICD-10-CM

## 2014-12-26 ENCOUNTER — Ambulatory Visit: Payer: 59 | Admitting: Family Medicine

## 2015-01-01 ENCOUNTER — Ambulatory Visit (INDEPENDENT_AMBULATORY_CARE_PROVIDER_SITE_OTHER): Payer: 59 | Admitting: Family Medicine

## 2015-01-01 ENCOUNTER — Encounter: Payer: Self-pay | Admitting: Family Medicine

## 2015-01-01 ENCOUNTER — Other Ambulatory Visit (INDEPENDENT_AMBULATORY_CARE_PROVIDER_SITE_OTHER): Payer: 59

## 2015-01-01 ENCOUNTER — Other Ambulatory Visit: Payer: 59

## 2015-01-01 VITALS — BP 140/80 | HR 76 | Wt 216.0 lb

## 2015-01-01 DIAGNOSIS — M67361 Transient synovitis, right knee: Secondary | ICD-10-CM | POA: Diagnosis not present

## 2015-01-01 DIAGNOSIS — M25562 Pain in left knee: Secondary | ICD-10-CM

## 2015-01-01 NOTE — Progress Notes (Signed)
Pre visit review using our clinic review tool, if applicable. No additional management support is needed unless otherwise documented below in the visit note. 

## 2015-01-01 NOTE — Patient Instructions (Addendum)
Good to see you.  Ice 20 minutes 2 times daily. Usually after activity and before bed. We will send in the fluid and if it is gout we will start medicine if it shows gout Colchicine take 2 times a day for 5 days.  We may need to change your blood pressure medicine to decrease the likelihood this would be gout.  See me again in 3-4 weeks.  Tart cherry extract at night

## 2015-01-01 NOTE — Assessment & Plan Note (Signed)
I believe the patient synovitis of the right knee is likely secondary to more of his gout. Patient did have aspiration and we will send to lab for further evaluation. We discussed icing regimen. Patient was given a steroid injection which will help. Patient will take a short course of the colchicine to see if this will be beneficial as well. We discussed possibly doing a preventative medication but he wants to discuss with primary care Monseratt Ledin. We did also change patient's hypertensive medication and get rid of the HCTZ. Patient then will come back in 3 weeks for further evaluation. I do not feel any imaging other than the ultrasound was needed today.

## 2015-01-01 NOTE — Progress Notes (Signed)
[facility] [address] [place], [place] [address] Phone: [phone] Subjective:    I'm seeing this patient by the request  of:  Georgetta Haber, MD   CC: Right knee pain  HGD:[id number] Michael Hays is a 44 y.o. male coming in with complaint of right knee pain. Patient states that this is been going on for multiple months now. Had a very similar presentation greater than 10 years ago that responded very well to an injection. 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 Patient is on his knees a significant amount and going up and down her's. Patient states that it's become more difficult to bend the knee in certain directions. Denies any sharp pains. More of a dull aching sensation. Has not notice any specific movements the seem to make it worse except her right pressure on the knee. Patient states also full flexion of the knee hurts. Sometimes at night it can be uncomfortable as well. Denies any radiation down the leg or any numbness. Denies any weakness. Rates the severity of pain a 5 out of 10.  Past Medical History  Diagnosis Date  . Hyperlipidemia   . Hypertension   . GERD (gastroesophageal reflux disease)   . Allergy   . Headache(784.0)    History reviewed. No pertinent past surgical history. Social History  Substance Use Topics  . Smoking status: Never Smoker   . Smokeless tobacco: Current User    Types: Chew  . Alcohol Use: 3.5 oz/week    7 drink(s) per week   Allergies  Allergen Reactions  . Atenolol   . Codeine   . Hydrocodone-Acetaminophen         Past medical history, social, surgical and family history all reviewed in electronic medical record.   Review of Systems: No headache, visual changes, nausea, vomiting, diarrhea, constipation, dizziness, abdominal pain, skin rash, fevers, chills, night sweats, weight loss, swollen lymph nodes, body aches, joint swelling, muscle aches, chest pain, shortness of breath, mood  changes.   Objective Blood pressure 140/80, pulse 76, weight 216 lb (97.977 kg).  General: No apparent distress alert and oriented x3 mood and affect normal, dressed appropriately.  HEENT: Pupils equal, extraocular movements intact  Respiratory: Patient's speak in full sentences and does not appear short of breath  Cardiovascular: No lower extremity edema, non tender, no erythema  Skin: Warm dry intact with no signs of infection or rash on extremities or on axial skeleton.  Abdomen: Soft nontender  Neuro: Cranial nerves II through XII are intact, neurovascularly intact in all extremities with 2+ DTRs and 2+ pulses.  Lymph: No lymphadenopathy of posterior or anterior cervical chain or axillae bilaterally.  Gait normal with good balance and coordination.  MSK:  Non tender with full range of motion and good stability and symmetric strength and tone of shoulders, elbows, wrist, hip, and ankles bilaterally.  Knee: Right Trace effusion noted Palpation normal with no warmth, joint line tenderness, patellar tenderness, or condyle tenderness. ROM full in flexion and extension and lower leg rotation. Ligaments with solid consistent endpoints including ACL, PCL, LCL, MCL. Negative Mcmurray's, Apley's, and Thessalonian tests. Non painful patellar compression. Patellar glide without crepitus. Patellar and quadriceps tendons unremarkable. Hamstring and quadriceps strength is normal.    MSK US performed of: Right knee This study was ordered, performed, and interpreted by Charlann Boxer D.O.  Knee: All structures visualized. Trace effusion noted possible uric acid crystal deposits Anteromedial, anterolateral, posteromedial, and posterolateral menisci  unremarkable without tearing, fraying, effusion, or displacement. Patellar Tendon unremarkable on long and transverse views without effusion. No abnormality of prepatellar bursa. Mild patellar arthritis noted LCL and MCL unremarkable on long and  transverse views. No abnormality of origin of medial or lateral head of the gastrocnemius.  IMPRESSION:  Possible uric acid with synovitis  Procedure: Real-time Ultrasound Guided Injection of left knee Device: GE Logiq E  Ultrasound guided injection is preferred based studies that show increased duration, increased effect, greater accuracy, decreased procedural pain, increased response rate, and decreased cost with ultrasound guided versus blind injection.  Verbal informed consent obtained.  Time-out conducted.  Noted no overlying erythema, induration, or other signs of local infection.  Skin prepped in a sterile fashion.  Local anesthesia: Topical Ethyl chloride.  With sterile technique and under real time ultrasound guidance: With a 22-gauge 2 inch needle patient was injected with 2 cc of 0.5% Marcaine and aspirated 5 mL of strawlike fluid with what appears to be crystal deposits and then injected 1 cc of Kenalog 40 mg/dL. This was from a superior lateral approach.  Completed without difficulty  Pain immediately resolved suggesting accurate placement of the medication.  Advised to call if fevers/chills, erythema, induration, drainage, or persistent bleeding.  Images permanently stored and available for review in the ultrasound unit.  Impression: Technically successful ultrasound guided injection.    Impression and Recommendations:     This case required medical decision making of moderate complexity.

## 2015-01-02 ENCOUNTER — Other Ambulatory Visit: Payer: Self-pay

## 2015-01-02 ENCOUNTER — Telehealth: Payer: Self-pay

## 2015-01-02 DIAGNOSIS — M25562 Pain in left knee: Secondary | ICD-10-CM

## 2015-01-02 LAB — URIC ACID, SYNOVIAL FLUID: Uric Acid, Synovial Fluid: 9.1 mg/dL — ABNORMAL HIGH (ref 0.0–8.0)

## 2015-01-02 MED ORDER — ALLOPURINOL 100 MG PO TABS: ORAL_TABLET | ORAL | Status: DC

## 2015-01-02 NOTE — Telephone Encounter (Signed)
Spoke with patient about lab results. Told him to continue colchicine for the 5days and then start the alloperonal until he comes back to see Dr. Tamala Julian

## 2015-01-31 ENCOUNTER — Ambulatory Visit: Payer: 59 | Admitting: Family Medicine

## 2015-02-11 ENCOUNTER — Other Ambulatory Visit: Payer: Self-pay | Admitting: *Deleted

## 2015-02-11 MED ORDER — COLCHICINE 0.6 MG PO TABS: 0.6000 mg | ORAL_TABLET | Freq: Two times a day (BID) | ORAL | Status: DC | PRN

## 2015-02-11 MED ORDER — INDOMETHACIN 50 MG PO CAPS: 50.0000 mg | ORAL_CAPSULE | Freq: Three times a day (TID) | ORAL | Status: DC | PRN

## 2015-02-11 NOTE — Telephone Encounter (Signed)
Per Dr. Arnoldo Morale send in Indomethacin 50 mg tid prn #60 with 6 refill and Colchicine 0.6 mg bid prn #30 with 6 refills.  Rx's sent in electronically to [facility], Dr. Arnoldo Morale aware he needs to see patient

## 2015-05-07 ENCOUNTER — Ambulatory Visit (INDEPENDENT_AMBULATORY_CARE_PROVIDER_SITE_OTHER): Payer: 59 | Admitting: Family Medicine

## 2015-05-07 ENCOUNTER — Encounter: Payer: Self-pay | Admitting: Family Medicine

## 2015-05-07 VITALS — BP 147/95 | HR 83 | Temp 97.4°F | Ht 69.0 in | Wt 212.8 lb

## 2015-05-07 DIAGNOSIS — M545 Low back pain, unspecified: Secondary | ICD-10-CM

## 2015-05-07 MED ORDER — OXYCODONE-ACETAMINOPHEN 7.5-325 MG PO TABS: 1.0000 | ORAL_TABLET | ORAL | Status: DC | PRN

## 2015-05-07 MED ORDER — METHOCARBAMOL 500 MG PO TABS
500.0000 mg | ORAL_TABLET | Freq: Four times a day (QID) | ORAL | Status: DC | PRN
Start: 2015-05-07 — End: 2015-11-28

## 2015-05-07 MED ORDER — PREDNISONE 20 MG PO TABS: 40.0000 mg | ORAL_TABLET | Freq: Every day | ORAL | Status: DC

## 2015-05-07 NOTE — Progress Notes (Signed)
   HPI  Patient presents today here today for low back pain.  Patient explains that he's had for about 5 days. Initially he thinks that he pick up something too heavy causing left-sided low back pain, he treated himself with NSAIDs and he got better the following day. One day after that he got a gas cylinder improperly causing acute onset repeat left-sided low back pain.  He describes it as a dull throbbing persistent left-sided low back pain worse with movement or lifting. No radiation, no sciatica.  No fever, chills, sweats. No leg weakness, bowel or bladder dysfunction, or saddle anesthesia.  He had some leftover oxycodone at home and had good relief with that when he could not sleep.   PMH: Smoking status noted ROS: Per HPI  Objective: BP 147/95 mmHg  Pulse 83  Temp(Src) 97.4 F (36.3 C) (Oral)  Ht 5\' 9"  (1.753 m)  Wt 212 lb 12.8 oz (96.525 kg)  BMI 31.41 kg/m2 Gen: NAD, alert, cooperative with exam HEENT: NCAT CV: RRR, good S1/S2, no murmur Resp: CTABL, no wheezes, non-labored Ext: No edema, warm Neuro: Alert and oriented, No gross deficits  MSK: L sided paraspinal muscle tenderness to palp in the lumbar area, No midline tenderness   Assessment and plan:  # Musculoskeletal back pain Consistent with muscle spasm or muscle strain Discussed heat, massage Prednisone course, Percocet for nighttime pain-we discussed caution around using narcotics. Robaxin for muscle relaxer, he's not tolerated Flexeril previously    Meds ordered this encounter  Medications  . predniSONE (DELTASONE) 20 MG tablet    Sig: Take 2 tablets (40 mg total) by mouth daily with breakfast.    Dispense:  10 tablet    Refill:  0  . oxyCODONE-acetaminophen (PERCOCET) 7.5-325 MG tablet    Sig: Take 1 tablet by mouth every 4 (four) hours as needed for severe pain.    Dispense:  15 tablet    Refill:  0  . methocarbamol (ROBAXIN) 500 MG tablet    Sig: Take 1 tablet (500 mg total) by mouth  every 6 (six) hours as needed for muscle spasms.    Dispense:  30 tablet    Refill:  [place], MD [facility] 05/07/2015, 2:49 PM

## 2015-05-07 NOTE — Patient Instructions (Signed)
Great to meet you!   Try the prednisone, I think this alone will do well for you Oxycodone at night, it is a narcotic, do not drive afterwards Robaxin (methacarbamol) is a muscle relaxer, this may make you sleepy as well  Also try heat, 15 min or so 3-4 times a day Tennis ball massage can be helpful as well.

## 2015-07-07 ENCOUNTER — Encounter: Payer: Self-pay | Admitting: *Deleted

## 2015-07-07 ENCOUNTER — Encounter (INDEPENDENT_AMBULATORY_CARE_PROVIDER_SITE_OTHER): Payer: Self-pay

## 2015-07-11 ENCOUNTER — Other Ambulatory Visit: Payer: Self-pay | Admitting: Internal Medicine

## 2015-07-11 MED ORDER — AZITHROMYCIN 250 MG PO TABS: ORAL_TABLET | ORAL | Status: DC

## 2015-09-10 ENCOUNTER — Other Ambulatory Visit: Payer: Self-pay | Admitting: *Deleted

## 2015-09-11 ENCOUNTER — Other Ambulatory Visit: Payer: Self-pay | Admitting: Internal Medicine

## 2015-09-11 DIAGNOSIS — M10061 Idiopathic gout, right knee: Secondary | ICD-10-CM

## 2015-09-11 DIAGNOSIS — I10 Essential (primary) hypertension: Secondary | ICD-10-CM

## 2015-09-11 MED ORDER — NADOLOL 40 MG PO TABS: ORAL_TABLET | ORAL | Status: DC

## 2015-09-11 MED ORDER — FEBUXOSTAT 40 MG PO TABS: 40.0000 mg | ORAL_TABLET | Freq: Every day | ORAL | Status: DC

## 2015-09-12 ENCOUNTER — Other Ambulatory Visit: Payer: Self-pay | Admitting: Internal Medicine

## 2015-09-12 MED ORDER — AZITHROMYCIN 250 MG PO TABS: ORAL_TABLET | ORAL | Status: DC

## 2015-09-16 ENCOUNTER — Other Ambulatory Visit: Payer: Self-pay | Admitting: Internal Medicine

## 2015-09-16 DIAGNOSIS — I10 Essential (primary) hypertension: Secondary | ICD-10-CM

## 2015-09-16 MED ORDER — NADOLOL 40 MG PO TABS: ORAL_TABLET | ORAL | Status: DC

## 2015-09-22 ENCOUNTER — Other Ambulatory Visit: Payer: Self-pay | Admitting: Internal Medicine

## 2015-09-22 DIAGNOSIS — M10071 Idiopathic gout, right ankle and foot: Secondary | ICD-10-CM

## 2015-09-22 MED ORDER — PREDNISONE 20 MG PO TABS: ORAL_TABLET | ORAL | Status: DC

## 2015-09-24 ENCOUNTER — Encounter: Payer: Self-pay | Admitting: Internal Medicine

## 2015-09-24 DIAGNOSIS — I10 Essential (primary) hypertension: Secondary | ICD-10-CM

## 2015-09-24 DIAGNOSIS — M109 Gout, unspecified: Secondary | ICD-10-CM

## 2015-09-24 NOTE — Progress Notes (Signed)
   Subjective:    Patient ID: Michael Hays, male    DOB: 06-Nov-1970, 45 y.o.   MRN: [id number]  HPI Patient with recurrent gout involing multiple joints. Hx of superior  response to steroids over NSAIDs. HTN, prediabetes risks. Family hx of gout in Father. Discussion of uloric for prevention. Current involvement of gout in right ankle and great toe. Pred dose pk to treat acute flare and then begin uloric.    Review of Systems  Constitutional: Negative.   HENT: Negative.   Eyes: Negative.   Respiratory: Negative.   Musculoskeletal: Positive for joint swelling.       Red swollen ankle joint on right        Objective:   Physical Exam  Nursing note reviewed.   No physical exam phone conversation only      Assessment & Plan:  multiarticular gout acute with chronic gout

## 2015-10-02 ENCOUNTER — Other Ambulatory Visit: Payer: Self-pay | Admitting: Internal Medicine

## 2015-10-02 DIAGNOSIS — I10 Essential (primary) hypertension: Secondary | ICD-10-CM

## 2015-10-02 MED ORDER — TELMISARTAN-HCTZ 80-12.5 MG PO TABS: 0.5000 | ORAL_TABLET | Freq: Every day | ORAL | Status: DC

## 2015-11-10 ENCOUNTER — Other Ambulatory Visit: Payer: Self-pay | Admitting: Family Medicine

## 2015-11-10 DIAGNOSIS — M109 Gout, unspecified: Secondary | ICD-10-CM

## 2015-11-10 DIAGNOSIS — E785 Hyperlipidemia, unspecified: Secondary | ICD-10-CM

## 2015-11-10 DIAGNOSIS — I10 Essential (primary) hypertension: Secondary | ICD-10-CM

## 2015-11-10 NOTE — Telephone Encounter (Signed)
Pre visit labs

## 2015-11-21 ENCOUNTER — Other Ambulatory Visit: Payer: 59

## 2015-11-21 DIAGNOSIS — E785 Hyperlipidemia, unspecified: Secondary | ICD-10-CM

## 2015-11-21 DIAGNOSIS — M109 Gout, unspecified: Secondary | ICD-10-CM

## 2015-11-21 DIAGNOSIS — I10 Essential (primary) hypertension: Secondary | ICD-10-CM | POA: Diagnosis not present

## 2015-11-22 LAB — CMP14+EGFR
A/G RATIO: 1.6 (ref 1.2–2.2)
ALT: 33 IU/L (ref 0–44)
AST: 21 IU/L (ref 0–40)
Albumin: 4.7 g/dL (ref 3.5–5.5)
Alkaline Phosphatase: 64 IU/L (ref 39–117)
BUN/Creatinine Ratio: 23 — ABNORMAL HIGH (ref 9–20)
BUN: 20 mg/dL (ref 6–24)
Bilirubin Total: 0.7 mg/dL (ref 0.0–1.2)
CALCIUM: 9.1 mg/dL (ref 8.7–10.2)
CO2: 23 mmol/L (ref 18–29)
CREATININE: 0.88 mg/dL (ref 0.76–1.27)
Chloride: 98 mmol/L (ref 96–106)
GFR, EST AFRICAN AMERICAN: 120 mL/min/{1.73_m2} (ref >59)
GFR, EST NON AFRICAN AMERICAN: 104 mL/min/{1.73_m2} (ref >59)
Globulin, Total: 2.9 g/dL (ref 1.5–4.5)
Glucose: 100 mg/dL — ABNORMAL HIGH (ref 65–99)
POTASSIUM: 4.7 mmol/L (ref 3.5–5.2)
Sodium: 139 mmol/L (ref 134–144)
TOTAL PROTEIN: 7.6 g/dL (ref 6.0–8.5)

## 2015-11-22 LAB — CBC WITH DIFFERENTIAL/PLATELET
BASOS: 1 %
Basophils Absolute: 0.1 10*3/uL (ref 0.0–0.2)
EOS (ABSOLUTE): 0.1 10*3/uL (ref 0.0–0.4)
EOS: 3 %
Hematocrit: 43.4 % (ref 37.5–51.0)
Hemoglobin: 14.5 g/dL (ref 12.6–17.7)
IMMATURE GRANS (ABS): 0 10*3/uL (ref 0.0–0.1)
IMMATURE GRANULOCYTES: 1 %
LYMPHS: 22 %
Lymphocytes Absolute: 1.1 10*3/uL (ref 0.7–3.1)
MCH: 29.5 pg (ref 26.6–33.0)
MCHC: 33.4 g/dL (ref 31.5–35.7)
MCV: 88 fL (ref 79–97)
MONOCYTES: 15 %
Monocytes Absolute: 0.8 10*3/uL (ref 0.1–0.9)
NEUTROS ABS: 3 10*3/uL (ref 1.4–7.0)
NEUTROS PCT: 58 %
PLATELETS: 251 10*3/uL (ref 150–379)
RBC: 4.91 x10E6/uL (ref 4.14–5.80)
RDW: 13.7 % (ref 12.3–15.4)
WBC: 5.1 10*3/uL (ref 3.4–10.8)

## 2015-11-22 LAB — LIPID PANEL
CHOL/HDL RATIO: 5.6 ratio — AB (ref 0.0–5.0)
Cholesterol, Total: 322 mg/dL — ABNORMAL HIGH (ref 100–199)
HDL: 57 mg/dL (ref >39)
LDL CALC: 226 mg/dL — AB (ref 0–99)
TRIGLYCERIDES: 195 mg/dL — AB (ref 0–149)
VLDL CHOLESTEROL CAL: 39 mg/dL (ref 5–40)

## 2015-11-22 LAB — URIC ACID: URIC ACID: 10.1 mg/dL — AB (ref 3.7–8.6)

## 2015-11-28 ENCOUNTER — Ambulatory Visit (INDEPENDENT_AMBULATORY_CARE_PROVIDER_SITE_OTHER): Payer: 59 | Admitting: Family Medicine

## 2015-11-28 ENCOUNTER — Encounter: Payer: Self-pay | Admitting: Family Medicine

## 2015-11-28 VITALS — BP 152/103 | HR 79 | Temp 97.6°F | Ht 69.0 in | Wt 200.8 lb

## 2015-11-28 DIAGNOSIS — Z0000 Encounter for general adult medical examination without abnormal findings: Secondary | ICD-10-CM | POA: Diagnosis not present

## 2015-11-28 DIAGNOSIS — E785 Hyperlipidemia, unspecified: Secondary | ICD-10-CM

## 2015-11-28 DIAGNOSIS — I10 Essential (primary) hypertension: Secondary | ICD-10-CM

## 2015-11-28 DIAGNOSIS — M109 Gout, unspecified: Secondary | ICD-10-CM

## 2015-11-28 MED ORDER — COLCHICINE 0.6 MG PO TABS: 0.6000 mg | ORAL_TABLET | Freq: Two times a day (BID) | ORAL | 1 refills | Status: DC

## 2015-11-28 MED ORDER — ALLOPURINOL 100 MG PO TABS: ORAL_TABLET | ORAL | 0 refills | Status: DC

## 2015-11-28 NOTE — Patient Instructions (Signed)
Great to see you!   I am starting you on allopurinol and colchicine at the same time.  Increase allopurinol as stated on the bottle and come back in the last week for a uric acid re-check.   I have also prescribed livalo for cholesterol  Come back in 4 weeks

## 2015-11-28 NOTE — Progress Notes (Signed)
   HPI  Patient presents today here for physical exam and review lab findings.  Hyperlipidemia Past has not tolerated statins, he states that he had still in joint aches, however he has some concerns that it's possible he actually had gout that time. He would like to try statins again. He's previously tried pravastatin, Crestor, and Lipitor with myalgias and arthralgias.  Gout Patient has intermittent right knee gout, recent uric acid level was 10 Previously released tried allopurinol and [place] he's had gout flares. He has done well with colchicine as a treatment. He is also done well with prednisone as a treatment.  Hypertension At home his blood pressure is been 130s over 80s, occasionally 140s as well. Good medication compliance, he takes HCTZ, telmisartan, nadolol No headaches, chest pain, dyspnea, palpitations. Beta blocker doubles as migraine prophylaxis.   PMH: Smoking status noted ROS: Per HPI  Objective: BP (!) 152/103   Pulse 79   Temp 97.6 F (36.4 C) (Oral)   Ht 5\' 9"  (1.753 m)   Wt 200 lb 12.8 oz (91.1 kg)   BMI 29.65 kg/m  Gen: NAD, alert, cooperative with exam HEENT: NCAT, PERRLA, EOMI, TMs normal bilaterally, oropharynx clear CV: RRR, good S1/S2, no murmur Resp: CTABL, no wheezes, non-labored Abd: SNTND, BS present, no guarding or organomegaly Ext: No edema, warm Neuro: Alert and oriented, strength 5/5 and sensation intact in all 4 extremities, 2+ patellar tendon reflexes bilaterally  Assessment and plan:  Annual exam Discussed diet and exercise, problems below.    # Hypertension Reasonably well controlled at home, elevating here today, on recheck as well Continue current medications Consider maximizing ARB and discontinuing HCTZ with gout.   # Hyperlipidemia Very elevated LDL, has fills previous statins. Trying livalo, samples given  # Gout Severe intermittent gout. Uric acid elevated to 10.1. Starting cultures seen twice daily while  titrating allopurinol, shows allopurinol instead of uloric for cost Repeat uric acid in 3-4 weeks   RTC in 4 weeks, titrating allopurinol with repeat CMP and uric acid in 3-4 weeks    Orders Placed This Encounter  Procedures  . Uric acid    Standing Status:   Future    Standing Expiration Date:   11/27/2016    Meds ordered this encounter  Medications  . colchicine 0.6 MG tablet    Sig: Take 1 tablet (0.6 mg total) by mouth 2 (two) times daily.    Dispense:  60 tablet    Refill:  1  . allopurinol (ZYLOPRIM) 100 MG tablet    Sig: 1 pill once daily for 1 week, then 2 pills daily for 1 week, then 3 pills daily until follow up    Dispense:  63 tablet    Refill:  [place], MD [facility] 11/28/2015, 10:35 AM

## 2015-12-05 ENCOUNTER — Other Ambulatory Visit: Payer: Self-pay | Admitting: Family Medicine

## 2015-12-30 ENCOUNTER — Other Ambulatory Visit: Payer: 59

## 2015-12-30 DIAGNOSIS — M109 Gout, unspecified: Secondary | ICD-10-CM

## 2015-12-30 DIAGNOSIS — E785 Hyperlipidemia, unspecified: Secondary | ICD-10-CM | POA: Diagnosis not present

## 2015-12-31 LAB — LIPID PANEL
Chol/HDL Ratio: 5 ratio (ref 0.0–5.0)
Cholesterol, Total: 264 mg/dL — ABNORMAL HIGH (ref 100–199)
HDL: 53 mg/dL (ref >39)
LDL Calculated: 154 mg/dL — ABNORMAL HIGH (ref 0–99)
Triglycerides: 285 mg/dL — ABNORMAL HIGH (ref 0–149)
VLDL Cholesterol Cal: 57 mg/dL — ABNORMAL HIGH (ref 5–40)

## 2015-12-31 LAB — CMP14+EGFR
ALT: 50 IU/L — ABNORMAL HIGH (ref 0–44)
AST: 33 IU/L (ref 0–40)
Albumin/Globulin Ratio: 1.8 (ref 1.2–2.2)
Albumin: 4.6 g/dL (ref 3.5–5.5)
Alkaline Phosphatase: 59 IU/L (ref 39–117)
BUN/Creatinine Ratio: 19 (ref 9–20)
BUN: 16 mg/dL (ref 6–24)
Bilirubin Total: 0.6 mg/dL (ref 0.0–1.2)
CO2: 24 mmol/L (ref 18–29)
Calcium: 9.5 mg/dL (ref 8.7–10.2)
Chloride: 99 mmol/L (ref 96–106)
Creatinine, Ser: 0.84 mg/dL (ref 0.76–1.27)
GFR calc Af Amer: 122 mL/min/1.73 (ref >59)
GFR calc non Af Amer: 106 mL/min/1.73 (ref >59)
Globulin, Total: 2.6 g/dL (ref 1.5–4.5)
Glucose: 96 mg/dL (ref 65–99)
Potassium: 4.6 mmol/L (ref 3.5–5.2)
Sodium: 139 mmol/L (ref 134–144)
Total Protein: 7.2 g/dL (ref 6.0–8.5)

## 2015-12-31 LAB — URIC ACID: URIC ACID: 6 mg/dL (ref 3.7–8.6)

## 2016-01-01 ENCOUNTER — Other Ambulatory Visit: Payer: Self-pay | Admitting: Family Medicine

## 2016-01-01 ENCOUNTER — Telehealth: Payer: Self-pay | Admitting: Family Medicine

## 2016-01-01 MED ORDER — ALLOPURINOL 300 MG PO TABS: 300.0000 mg | ORAL_TABLET | Freq: Every day | ORAL | 6 refills | Status: DC

## 2016-01-01 NOTE — Telephone Encounter (Signed)
Left vm, will give labs to his wife, I have summarized them on paper.  Sent in 300 mg allopurinol, 1 pill once daily Stop colchicine in one week Continue current dose of statin, repeat labs in about one month with slightly elevated ALT.   ALT may be elevated due to statin or allopurinol.  Laroy Apple, MD [facility] 01/01/2016, 7:13 PM

## 2016-01-05 ENCOUNTER — Telehealth: Payer: Self-pay | Admitting: Family Medicine

## 2016-01-05 MED ORDER — AMLODIPINE BESYLATE 5 MG PO TABS: 5.0000 mg | ORAL_TABLET | Freq: Every day | ORAL | 3 refills | Status: DC

## 2016-01-05 MED ORDER — PITAVASTATIN CALCIUM 1 MG PO TABS: 1.0000 mg | ORAL_TABLET | Freq: Every day | ORAL | 3 refills | Status: DC

## 2016-01-05 NOTE — Telephone Encounter (Signed)
Called and discussed labs and recent changes  Change micardis to amlodipine, Pt feels that it is causing myalgias.   Livalo was well tolerated he thinks, sent Rx.   Will continue colchicine for a few more weeks while getting on 300 mg allopurinol ( he is past induction but has run out for a few days waiting for mail-order)  F/u 4-6 weeks. BP check, LFT check, Titrate livalo , repeat LDL.  Turns out he had not continued livalo. (only samples)  Laroy Apple, MD [facility] 01/05/2016, 3:32 PM

## 2016-02-25 ENCOUNTER — Telehealth: Payer: Self-pay | Admitting: Family Medicine

## 2016-02-25 MED ORDER — PREDNISONE 10 MG (21) PO TBPK: ORAL_TABLET | ORAL | 1 refills | Status: DC

## 2016-02-25 NOTE — Telephone Encounter (Signed)
- 

## 2016-02-25 NOTE — Telephone Encounter (Signed)
sent 

## 2016-04-16 ENCOUNTER — Other Ambulatory Visit: Payer: Self-pay | Admitting: Family Medicine

## 2016-04-16 DIAGNOSIS — I10 Essential (primary) hypertension: Secondary | ICD-10-CM

## 2016-04-16 MED ORDER — NADOLOL 40 MG PO TABS: ORAL_TABLET | ORAL | 1 refills | Status: DC

## 2016-04-16 NOTE — Telephone Encounter (Signed)
Med refill sent to pharmacy  

## 2016-04-28 DIAGNOSIS — K1329 Other disturbances of oral epithelium, including tongue: Secondary | ICD-10-CM | POA: Diagnosis not present

## 2016-04-29 DIAGNOSIS — K1329 Other disturbances of oral epithelium, including tongue: Secondary | ICD-10-CM | POA: Diagnosis not present

## 2016-05-15 DIAGNOSIS — H5213 Myopia, bilateral: Secondary | ICD-10-CM | POA: Diagnosis not present

## 2016-05-18 ENCOUNTER — Other Ambulatory Visit: Payer: Self-pay

## 2016-05-18 DIAGNOSIS — I10 Essential (primary) hypertension: Secondary | ICD-10-CM

## 2016-05-18 MED ORDER — AMLODIPINE BESYLATE 5 MG PO TABS
5.0000 mg | ORAL_TABLET | Freq: Every day | ORAL | 3 refills | Status: DC
Start: 2016-05-18 — End: 2017-08-22

## 2016-05-18 MED ORDER — PITAVASTATIN CALCIUM 1 MG PO TABS: 1.0000 mg | ORAL_TABLET | Freq: Every day | ORAL | 3 refills | Status: DC

## 2016-05-18 MED ORDER — NADOLOL 40 MG PO TABS: ORAL_TABLET | ORAL | 3 refills | Status: DC

## 2016-05-18 MED FILL — LIVALO 1 MG TABLET: 1 | 90 days supply | Qty: 90 | Fill #0

## 2016-05-19 MED FILL — NADOLOL 40 MG TABLET: 40 | 90 days supply | Qty: 90 | Fill #0

## 2016-06-04 DIAGNOSIS — K1329 Other disturbances of oral epithelium, including tongue: Secondary | ICD-10-CM | POA: Diagnosis not present

## 2016-06-07 MED FILL — AMLODIPINE BESYLATE 5 MG TA: 5 | 90 days supply | Qty: 90 | Fill #0 | Status: TO

## 2016-07-19 ENCOUNTER — Other Ambulatory Visit: Payer: Self-pay | Admitting: Internal Medicine

## 2016-07-19 DIAGNOSIS — M109 Gout, unspecified: Secondary | ICD-10-CM

## 2016-07-19 MED ORDER — PREDNISONE 20 MG PO TABS: ORAL_TABLET | ORAL | 1 refills | Status: DC

## 2016-08-20 MED FILL — NADOLOL 40 MG TABLET: 40 | 90 days supply | Qty: 90 | Fill #1 | Status: TO

## 2016-09-29 MED FILL — AMLODIPINE BESYLATE 5 MG TA: 5 | 90 days supply | Qty: 90 | Fill #0

## 2016-10-12 ENCOUNTER — Telehealth: Payer: Self-pay | Admitting: Family Medicine

## 2016-10-12 MED ORDER — PRAVASTATIN SODIUM 20 MG PO TABS: 20.0000 mg | ORAL_TABLET | Freq: Every day | ORAL | 3 refills | Status: DC

## 2016-10-12 MED ORDER — PREDNISONE 20 MG PO TABS: 40.0000 mg | ORAL_TABLET | Freq: Every day | ORAL | 0 refills | Status: DC

## 2016-10-12 NOTE — Telephone Encounter (Signed)
What is the name of the medication? Prednisone  Have you contacted your pharmacy to request a refill? NO  Which pharmacy would you like this sent to? [facility] in [place]  Patient notified that their request is being sent to the clinical staff for review and that they should receive a call once it is complete. If they do not receive a call within 24 hours they can check with their pharmacy or our office.

## 2016-10-12 NOTE — Telephone Encounter (Signed)
Pt wants to speak with Dr Wendi Snipes regarding cholesterol med

## 2016-10-12 NOTE — Telephone Encounter (Signed)
Wife aware per dpr and will make patient apt in 2-3 months.

## 2016-10-12 NOTE — Telephone Encounter (Signed)
Returning patient's call.  Patient is going out of town in a few days and would like to take a course of prednisone just in case he has a gout flare.  He states that he tolerated pitavistatin for about 2-3 months and then had severe pains, he has discontinued it now, His brother is tolerating pravastatin and he would like to try this if possible.  I have recommended that in general we do have visits for this type of clinical question, however I'm glad to send these medications in at this time. We will have him in for a follow-up for follow-up labs in about 2-3 months.  Laroy Apple, MD [facility] 10/12/2016, 12:19 PM

## 2016-11-19 MED FILL — NADOLOL 40 MG TABLET: 40 | 90 days supply | Qty: 90 | Fill #0

## 2016-11-25 ENCOUNTER — Ambulatory Visit: Payer: 59

## 2016-11-25 ENCOUNTER — Ambulatory Visit: Payer: 59 | Admitting: Pediatrics

## 2016-11-25 ENCOUNTER — Encounter: Payer: Self-pay | Admitting: Pediatrics

## 2016-11-25 ENCOUNTER — Ambulatory Visit (INDEPENDENT_AMBULATORY_CARE_PROVIDER_SITE_OTHER): Payer: 59 | Admitting: Pediatrics

## 2016-11-25 VITALS — BP 178/104 | HR 85 | Temp 98.4°F | Ht 69.0 in | Wt 208.0 lb

## 2016-11-25 DIAGNOSIS — S91331A Puncture wound without foreign body, right foot, initial encounter: Secondary | ICD-10-CM | POA: Diagnosis not present

## 2016-11-25 DIAGNOSIS — I10 Essential (primary) hypertension: Secondary | ICD-10-CM | POA: Diagnosis not present

## 2016-11-25 DIAGNOSIS — Z23 Encounter for immunization: Secondary | ICD-10-CM

## 2016-11-25 DIAGNOSIS — T148XXA Other injury of unspecified body region, initial encounter: Secondary | ICD-10-CM

## 2016-11-25 MED ORDER — LEVOFLOXACIN 500 MG PO TABS: 500.0000 mg | ORAL_TABLET | Freq: Every day | ORAL | 0 refills | Status: DC

## 2016-11-25 NOTE — Progress Notes (Signed)
  Subjective:   Patient ID: Michael Hays, male    DOB: May 05, 1970, 46 y.o.   MRN: [id number] CC: Puncture Wound (stepped on screw today)  HPI: [name] is a 46 y.o. male presenting for Puncture Wound (stepped on screw today)  Stepped on screw this morning about 10am Says it was a clean screw When through his tennis shoe, went mostly through his foot Came in for tetanus shot  HTN: takes amlodipine every other day bc makes him draggy sometimes lightheaded in the morning Says BP is usually better at home As low as 106/60 Not checking regularly No CP, no SOB nadalol every day   Relevant past medical, surgical, family and social history reviewed. Allergies and medications reviewed and updated. History  Smoking Status  . Never Smoker  Smokeless Tobacco  . Current User  . Types: Chew   ROS: Per HPI   Objective:    BP (!) 178/104   Pulse 85   Temp 98.4 F (36.9 C) (Oral)   Ht 5\' 9"  (1.753 m)   Wt 208 lb (94.3 kg)   BMI 30.72 kg/m   Wt Readings from Last 3 Encounters:  11/25/16 208 lb (94.3 kg)  11/28/15 200 lb 12.8 oz (91.1 kg)  05/07/15 212 lb 12.8 oz (96.5 kg)    Gen: NAD, alert, cooperative with exam, NCAT EYES: EOMI, no conjunctival injection, or no icterus CV: distal pulses 2+ b/l Resp:normal WOB Ext: No edema, warm Neuro: Alert and oriented MSK: normal muscle bulk Skin: R ball of foot with 37mm puncture wound, no surrounding redness  Assessment & Plan:  Mykale was seen today for puncture wound.  Diagnoses and all orders for this visit:  Puncture wound Higher risk for infection given went through his tennis Discussed risks/benefits of doing ppx abx vs monitoring for infection, pt wants to do ppx, will give 3 days of below -     Td : Tetanus/diphtheria >7yo Preservative  free -     levofloxacin (LEVAQUIN) 500 MG tablet; Take 1 tablet (500 mg total) by mouth daily.  Essential hypertension Elevated today Discussed checking Bps regularly at home, taking  BP meds daily Has f/u with PCP upcoming Bring BPs from home to that visit  Follow up plan: Return in about 4 weeks (around 12/23/2016). Assunta Found, MD [facility]

## 2016-12-27 ENCOUNTER — Ambulatory Visit: Payer: 59 | Admitting: Family Medicine

## 2017-02-09 MED FILL — NADOLOL 40 MG TABLET: 40 | 90 days supply | Qty: 90 | Fill #1

## 2017-02-09 MED FILL — AMLODIPINE BESYLATE 5 MG TA: 5 | 90 days supply | Qty: 90 | Fill #1

## 2017-03-18 ENCOUNTER — Ambulatory Visit (INDEPENDENT_AMBULATORY_CARE_PROVIDER_SITE_OTHER): Payer: 59

## 2017-03-18 ENCOUNTER — Ambulatory Visit (INDEPENDENT_AMBULATORY_CARE_PROVIDER_SITE_OTHER): Payer: 59 | Admitting: Family Medicine

## 2017-03-18 ENCOUNTER — Encounter: Payer: Self-pay | Admitting: Family Medicine

## 2017-03-18 VITALS — BP 161/103 | HR 79 | Temp 97.5°F | Ht 69.0 in | Wt 208.0 lb

## 2017-03-18 DIAGNOSIS — I10 Essential (primary) hypertension: Secondary | ICD-10-CM

## 2017-03-18 DIAGNOSIS — E785 Hyperlipidemia, unspecified: Secondary | ICD-10-CM | POA: Diagnosis not present

## 2017-03-18 DIAGNOSIS — M109 Gout, unspecified: Secondary | ICD-10-CM

## 2017-03-18 DIAGNOSIS — M25561 Pain in right knee: Secondary | ICD-10-CM | POA: Diagnosis not present

## 2017-03-18 DIAGNOSIS — M179 Osteoarthritis of knee, unspecified: Secondary | ICD-10-CM | POA: Diagnosis not present

## 2017-03-18 MED ORDER — COLCHICINE 0.6 MG PO TABS: ORAL_TABLET | ORAL | 2 refills | Status: DC

## 2017-03-18 MED ORDER — PRAVASTATIN SODIUM 20 MG PO TABS: 20.0000 mg | ORAL_TABLET | Freq: Every day | ORAL | 3 refills | Status: DC

## 2017-03-18 MED ORDER — NADOLOL 40 MG PO TABS: ORAL_TABLET | ORAL | 3 refills | Status: DC

## 2017-03-18 NOTE — Progress Notes (Signed)
HPI  Patient presents today for knee pain and follow-up of chronic medical conditions.  Right knee pain Patient states this is been going on since he was 46 years old. Patient states that it comes and goes with redness and swelling of the right knee and improves after a few days.  He states that he is been working on his knees recently a lot repairing a floor at home, he works as a Chief Strategy Officer. He states that 3 days ago it was so painful that he had difficulty standing on it. He usually has gout in his left great toe, he states this is a very different pain.  He states that this has been tapped by orthopedics previously and studies were done and afterwards he was told he had gout in the knee.  He does not believe this.  Gout Patient did not continue allopurinol, he states this is because his liver enzyme was up, I did not instruct him to stop.  HTN Patient has a blood pressure log that shows blood pressures average 150s in the morning and 1 teens at night. He denies headaches, chest pain.  Hyperlipidemia Tolerating pravastatin well  PMH: Smoking status noted ROS: Per HPI  Objective: BP (!) 161/103   Pulse 79   Temp (!) 97.5 F (36.4 C) (Oral)   Ht _0  (1.753 m)   Wt 208 lb (94.3 kg)   BMI 30.72 kg/m  Gen: NAD, alert, cooperative with exam HEENT: NCAT CV: RRR, good S1/S2, no murmur Resp: CTABL, no wheezes, non-labored Ext: No edema, warm Neuro: Alert and oriented, No gross deficits  MSK: R knee without erythema, effusion, bruising, or gross deformity No joint line tenderness.  ligamentously intact to Lachman's and with varus and valgus stress.  Negative McMurray's test   Assessment and plan:  #Right knee pain Likely multifactorial, if he has had positive testing for gout from an effusion I explained that this is the gold standard positive test.  It is possible that he has multifactorial knee pain with OA superimposed. 3 pending, patient would like to evaluate for  OA as well. Recommended treatment with rheumatology at [facility] given that he has had difficulty continuing the treatment that I have recommended  #Gout Patient did not take allopurinol any longer. Continue colchicine as needed Recommended rheumatology he will consider  #Hyperlipidemia Tolerating pravastatin, refilled 2-4 weeks  #Hypertension Uncontrolled in the clinic, patient has elevated blood pressures in the a.m. and normal at night, he takes nadolol at night and amlodipine in the morning. Should have good morning hypertension coverage, this is likely due to increased work stress that the patient experiences as he prepares for work.  No changes    Orders Placed This Encounter  Procedures  . DG Knee 1-2 Views Right    Standing Status:   Future    Number of Occurrences:   1    Standing Expiration Date:   05/18/2018    Order Specific Question:   Reason for Exam (SYMPTOM  OR DIAGNOSIS REQUIRED)    Answer:   R knee OA vs gout    Order Specific Question:   Preferred imaging location?    Answer:   Internal  . CBC with Differential/Platelet    Standing Status:   Future    Standing Expiration Date:   03/18/2018  . CMP14+EGFR    Standing Status:   Future    Standing Expiration Date:   03/18/2018  . Lipid panel    Standing Status:  Future    Standing Expiration Date:   03/18/2018  . TSH    Standing Status:   Future    Standing Expiration Date:   03/18/2018    Meds ordered this encounter  Medications  . pravastatin (PRAVACHOL) 20 MG tablet    Sig: Take 1 tablet (20 mg total) by mouth daily.    Dispense:  90 tablet    Refill:  3  . colchicine 0.6 MG tablet    Sig: 2 pills at first sign of flare and 1 pill one hour later.    Dispense:  30 tablet    Refill:  2  . nadolol (CORGARD) 40 MG tablet    Sig: TAKE ONE TABLET BY MOUTH EVERY DAY    Dispense:  90 tablet    Refill:  [place], MD [facility] 03/18/2017, 9:55  AM

## 2017-04-20 ENCOUNTER — Encounter: Payer: Self-pay | Admitting: Physician Assistant

## 2017-04-20 ENCOUNTER — Ambulatory Visit (INDEPENDENT_AMBULATORY_CARE_PROVIDER_SITE_OTHER): Payer: 59 | Admitting: Physician Assistant

## 2017-04-20 VITALS — BP 163/108 | HR 78 | Temp 98.6°F | Ht 69.0 in | Wt 217.8 lb

## 2017-04-20 DIAGNOSIS — J111 Influenza due to unidentified influenza virus with other respiratory manifestations: Secondary | ICD-10-CM

## 2017-04-20 DIAGNOSIS — M25561 Pain in right knee: Secondary | ICD-10-CM

## 2017-04-20 DIAGNOSIS — G8929 Other chronic pain: Secondary | ICD-10-CM | POA: Insufficient documentation

## 2017-04-20 DIAGNOSIS — I10 Essential (primary) hypertension: Secondary | ICD-10-CM

## 2017-04-20 MED ORDER — PRAVASTATIN SODIUM 20 MG PO TABS: 20.0000 mg | ORAL_TABLET | Freq: Every day | ORAL | 3 refills | Status: DC

## 2017-04-20 MED ORDER — NADOLOL 40 MG PO TABS: ORAL_TABLET | ORAL | 3 refills | Status: DC

## 2017-04-20 MED ORDER — OSELTAMIVIR PHOSPHATE 75 MG PO CAPS: 75.0000 mg | ORAL_CAPSULE | Freq: Two times a day (BID) | ORAL | 0 refills | Status: DC

## 2017-04-20 MED FILL — SHIPPING COST: 1 days supply | Qty: 1 | Fill #0

## 2017-04-20 MED FILL — PRAVASTATIN SODIUM 20 MG TA: 20 | 90 days supply | Qty: 90 | Fill #0

## 2017-04-20 NOTE — Progress Notes (Signed)
BP (!) 163/108   Pulse 78   Temp 98.6 F (37 C) (Oral)   Ht 5' 9"  (1.753 m)   Wt 217 lb 12.8 oz (98.8 kg)   BMI 32.16 kg/m    Subjective:    Patient ID: Michael Hays, male    DOB: 01/15/1971, 47 y.o.   MRN: [id number]  HPI: Michael Hays is a 47 y.o. male presenting on 04/20/2017 for Cough; Headache; Sore Throat; and no energy  2 nights ago the patient woke up having severe body aches and feeling sick.  He came on very suddenly.  He has had a headache sore throat extremely fatigued.  He has no sinus pressure no drainage at this time.  He denies any nausea or vomiting.  Patient has also had chronic right knee pain.  He will be having a uric acid performed today.  There is a discussion of gout versus pseudogout versus other etiology.  Relevant past medical, surgical, family and social history reviewed and updated as indicated. Allergies and medications reviewed and updated.  Past Medical History:  Diagnosis Date  . Allergy   . GERD (gastroesophageal reflux disease)   . Headache(784.0)   . Hyperlipidemia   . Hypertension     History reviewed. No pertinent surgical history.  Review of Systems  Constitutional: Positive for activity change, appetite change and fatigue. Negative for fever.  HENT: Positive for congestion and sore throat. Negative for sinus pressure.   Eyes: Negative.  Negative for pain and visual disturbance.  Respiratory: Negative for cough, chest tightness, shortness of breath and wheezing.   Cardiovascular: Negative.  Negative for chest pain, palpitations and leg swelling.  Gastrointestinal: Positive for nausea. Negative for abdominal pain, diarrhea and vomiting.  Endocrine: Negative.   Genitourinary: Negative.   Musculoskeletal: Positive for back pain and myalgias. Negative for arthralgias.  Skin: Negative.  Negative for color change and rash.  Neurological: Positive for headaches. Negative for weakness and numbness.  Psychiatric/Behavioral: Negative.      Allergies as of 04/20/2017      Reactions   Atenolol    Codeine    Hydrocodone-acetaminophen       Medication List        Accurate as of 04/20/17 12:54 PM. Always use your most recent med list.          amLODipine 5 MG tablet Commonly known as:  NORVASC Take 1 tablet (5 mg total) by mouth daily.   aspirin 325 MG tablet Take 325 mg by mouth daily.   colchicine 0.6 MG tablet 2 pills at first sign of flare and 1 pill one hour later.   nadolol 40 MG tablet Commonly known as:  CORGARD TAKE ONE TABLET BY MOUTH EVERY DAY   oseltamivir 75 MG capsule Commonly known as:  TAMIFLU Take 1 capsule (75 mg total) by mouth 2 (two) times daily.   pravastatin 20 MG tablet Commonly known as:  PRAVACHOL Take 1 tablet (20 mg total) by mouth daily.          Objective:    BP (!) 163/108   Pulse 78   Temp 98.6 F (37 C) (Oral)   Ht 5' 9"  (1.753 m)   Wt 217 lb 12.8 oz (98.8 kg)   BMI 32.16 kg/m   Allergies  Allergen Reactions  . Atenolol   . Codeine   . Hydrocodone-Acetaminophen     Physical Exam  Constitutional: He is oriented to person, place, and time. He appears well-developed and well-nourished.  He appears distressed.  HENT:  Head: Normocephalic and atraumatic.  Right Ear: Tympanic membrane normal. No drainage. No middle ear effusion.  Left Ear: Tympanic membrane normal. No drainage.  No middle ear effusion.  Nose: Mucosal edema and rhinorrhea present. Right sinus exhibits no maxillary sinus tenderness. Left sinus exhibits no maxillary sinus tenderness.  Mouth/Throat: Uvula is midline. Posterior oropharyngeal erythema present. No oropharyngeal exudate.  Eyes: Conjunctivae and EOM are normal. Pupils are equal, round, and reactive to light. Right eye exhibits no discharge. Left eye exhibits no discharge.  Neck: Normal range of motion.  Cardiovascular: Normal rate, regular rhythm and normal heart sounds.  Pulmonary/Chest: Effort normal and breath sounds normal. No  respiratory distress. He has no wheezes.  Abdominal: Soft.  Lymphadenopathy:    He has no cervical adenopathy.  Neurological: He is alert and oriented to person, place, and time.  Skin: Skin is warm and dry.  Psychiatric: He has a normal mood and affect. His behavior is normal.  Nursing note and vitals reviewed.   Results for orders placed or performed in visit on 12/30/15  Uric acid  Result Value Ref Range   Uric Acid 6.0 3.7 - 8.6 mg/dL  CMP14+EGFR  Result Value Ref Range   Glucose 96 65 - 99 mg/dL   BUN 16 6 - 24 mg/dL   Creatinine, Ser 0.84 0.76 - 1.27 mg/dL   GFR calc non Af Amer 106 >59 mL/min/1.73   GFR calc Af Amer 122 >59 mL/min/1.73   BUN/Creatinine Ratio 19 9 - 20   Sodium 139 134 - 144 mmol/L   Potassium 4.6 3.5 - 5.2 mmol/L   Chloride 99 96 - 106 mmol/L   CO2 24 18 - 29 mmol/L   Calcium 9.5 8.7 - 10.2 mg/dL   Total Protein 7.2 6.0 - 8.5 g/dL   Albumin 4.6 3.5 - 5.5 g/dL   Globulin, Total 2.6 1.5 - 4.5 g/dL   Albumin/Globulin Ratio 1.8 1.2 - 2.2   Bilirubin Total 0.6 0.0 - 1.2 mg/dL   Alkaline Phosphatase 59 39 - 117 IU/L   AST 33 0 - 40 IU/L   ALT 50 (H) 0 - 44 IU/L  Lipid panel  Result Value Ref Range   Cholesterol, Total 264 (H) 100 - 199 mg/dL   Triglycerides 285 (H) 0 - 149 mg/dL   HDL 53 >39 mg/dL   VLDL Cholesterol Cal 57 (H) 5 - 40 mg/dL   LDL Calculated 154 (H) 0 - 99 mg/dL   Chol/HDL Ratio 5.0 0.0 - 5.0 ratio units      Assessment & Plan:   1. Essential hypertension - nadolol (CORGARD) 40 MG tablet; TAKE ONE TABLET BY MOUTH EVERY DAY  Dispense: 90 tablet; Refill: 3  2. Chronic pain of right knee - Uric Acid  3. Influenza - oseltamivir (TAMIFLU) 75 MG capsule; Take 1 capsule (75 mg total) by mouth 2 (two) times daily.  Dispense: 10 capsule; Refill: 0 Supportive care   Current Outpatient Medications:  .  amLODipine (NORVASC) 5 MG tablet, Take 1 tablet (5 mg total) by mouth daily., Disp: 90 tablet, Rfl: 3 .  aspirin 325 MG tablet, Take  325 mg by mouth daily.  , Disp: , Rfl:  .  colchicine 0.6 MG tablet, 2 pills at first sign of flare and 1 pill one hour later., Disp: 30 tablet, Rfl: 2 .  nadolol (CORGARD) 40 MG tablet, TAKE ONE TABLET BY MOUTH EVERY DAY, Disp: 90 tablet, Rfl: 3 .  pravastatin (  PRAVACHOL) 20 MG tablet, Take 1 tablet (20 mg total) by mouth daily., Disp: 90 tablet, Rfl: 3 .  oseltamivir (TAMIFLU) 75 MG capsule, Take 1 capsule (75 mg total) by mouth 2 (two) times daily., Disp: 10 capsule, Rfl: 0 Continue all other maintenance medications as listed above.  Follow up plan: No Follow-up on file.  Educational handout given for [place] PA-C [place] [address]  [place], [place] [address] [phone]   04/20/2017, 12:54 PM

## 2017-04-20 NOTE — Patient Instructions (Signed)
In a few days you may receive a survey in the mail or online from [employer] regarding your visit with us today. Please take a moment to fill this out. Your feedback is very important to our whole office. It can help us better understand your needs as well as improve your experience and satisfaction. Thank you for taking your time to complete it. We care about you.  Carlissa Pesola, PA-C  

## 2017-04-21 LAB — URIC ACID: Uric Acid: 8.3 mg/dL (ref 3.7–8.6)

## 2017-04-22 MED FILL — NADOLOL 40 MG TABLET: 40 | 90 days supply | Qty: 90 | Fill #0

## 2017-04-24 LAB — CMP14+EGFR
A/G RATIO: 1.7 (ref 1.2–2.2)
ALBUMIN: 4.5 g/dL (ref 3.5–5.5)
ALT: 66 IU/L — ABNORMAL HIGH (ref 0–44)
AST: 55 IU/L — ABNORMAL HIGH (ref 0–40)
Alkaline Phosphatase: 77 IU/L (ref 39–117)
BUN / CREAT RATIO: 20 (ref 9–20)
BUN: 18 mg/dL (ref 6–24)
Bilirubin Total: 0.2 mg/dL (ref 0.0–1.2)
CO2: 22 mmol/L (ref 20–29)
CREATININE: 0.9 mg/dL (ref 0.76–1.27)
Calcium: 9.2 mg/dL (ref 8.7–10.2)
Chloride: 97 mmol/L (ref 96–106)
GFR calc non Af Amer: 102 mL/min/{1.73_m2} (ref >59)
GFR, EST AFRICAN AMERICAN: 118 mL/min/{1.73_m2} (ref >59)
GLOBULIN, TOTAL: 2.7 g/dL (ref 1.5–4.5)
Glucose: 104 mg/dL — ABNORMAL HIGH (ref 65–99)
POTASSIUM: 4.5 mmol/L (ref 3.5–5.2)
SODIUM: 137 mmol/L (ref 134–144)
TOTAL PROTEIN: 7.2 g/dL (ref 6.0–8.5)

## 2017-04-24 LAB — LIPID PANEL
CHOL/HDL RATIO: 5.3 ratio — AB (ref 0.0–5.0)
Cholesterol, Total: 266 mg/dL — ABNORMAL HIGH (ref 100–199)
HDL: 50 mg/dL (ref >39)
LDL CALC: 186 mg/dL — AB (ref 0–99)
Triglycerides: 148 mg/dL (ref 0–149)
VLDL Cholesterol Cal: 30 mg/dL (ref 5–40)

## 2017-04-24 LAB — SPECIMEN STATUS REPORT

## 2017-04-24 LAB — TSH: TSH: 1.42 u[IU]/mL (ref 0.450–4.500)

## 2017-05-12 MED FILL — SHIPPING COST: 1 days supply | Qty: 1 | Fill #0

## 2017-05-12 MED FILL — AMLODIPINE BESYLATE 5 MG TA: 5 | 90 days supply | Qty: 90 | Fill #2

## 2017-07-02 ENCOUNTER — Other Ambulatory Visit: Payer: Self-pay | Admitting: Internal Medicine

## 2017-07-02 NOTE — Progress Notes (Unsigned)
Pt with hx of gout called with painful swollen knee without redness or warmth. Hx of hyperuricemia. No hx of direct trauma Failed naprosyn and colchicine

## 2017-08-10 MED FILL — NADOLOL 40 MG TABLET: 40 | 90 days supply | Qty: 90 | Fill #1

## 2017-08-10 MED FILL — SHIPPING COST: 1 days supply | Qty: 1 | Fill #1

## 2017-08-22 ENCOUNTER — Telehealth: Payer: Self-pay | Admitting: *Deleted

## 2017-08-22 MED ORDER — AMLODIPINE BESYLATE 5 MG PO TABS: 5.0000 mg | ORAL_TABLET | Freq: Every day | ORAL | 0 refills | Status: DC

## 2017-08-22 MED FILL — AMLODIPINE BESYLATE 5 MG TA: 5 | 90 days supply | Qty: 90 | Fill #0

## 2017-08-22 MED FILL — SHIPPING COST: 1 days supply | Qty: 1 | Fill #2

## 2017-08-22 NOTE — Telephone Encounter (Signed)
Ov 09/15/17

## 2017-09-15 ENCOUNTER — Ambulatory Visit: Payer: 59 | Admitting: Family Medicine

## 2017-09-15 MED FILL — SHIPPING COST: 1 days supply | Qty: 1 | Fill #3

## 2017-09-15 MED FILL — PRAVASTATIN SODIUM 20 MG TA: 20 | 90 days supply | Qty: 90 | Fill #1

## 2017-09-22 ENCOUNTER — Other Ambulatory Visit: Payer: Self-pay

## 2017-09-22 DIAGNOSIS — I10 Essential (primary) hypertension: Secondary | ICD-10-CM

## 2017-09-22 DIAGNOSIS — E785 Hyperlipidemia, unspecified: Secondary | ICD-10-CM

## 2017-09-22 DIAGNOSIS — M109 Gout, unspecified: Secondary | ICD-10-CM

## 2017-09-26 ENCOUNTER — Other Ambulatory Visit: Payer: 59

## 2017-09-26 DIAGNOSIS — I10 Essential (primary) hypertension: Secondary | ICD-10-CM

## 2017-09-26 DIAGNOSIS — M109 Gout, unspecified: Secondary | ICD-10-CM

## 2017-09-26 DIAGNOSIS — E785 Hyperlipidemia, unspecified: Secondary | ICD-10-CM

## 2017-09-27 LAB — CBC WITH DIFFERENTIAL/PLATELET
BASOS: 1 %
Basophils Absolute: 0.1 10*3/uL (ref 0.0–0.2)
EOS (ABSOLUTE): 0.2 10*3/uL (ref 0.0–0.4)
EOS: 3 %
HEMATOCRIT: 43.5 % (ref 37.5–51.0)
Hemoglobin: 14.8 g/dL (ref 13.0–17.7)
IMMATURE GRANULOCYTES: 0 %
Immature Grans (Abs): 0 10*3/uL (ref 0.0–0.1)
Lymphocytes Absolute: 1.4 10*3/uL (ref 0.7–3.1)
Lymphs: 28 %
MCH: 29.8 pg (ref 26.6–33.0)
MCHC: 34 g/dL (ref 31.5–35.7)
MCV: 88 fL (ref 79–97)
MONOS ABS: 0.5 10*3/uL (ref 0.1–0.9)
Monocytes: 10 %
NEUTROS ABS: 2.8 10*3/uL (ref 1.4–7.0)
NEUTROS PCT: 58 %
Platelets: 250 10*3/uL (ref 150–450)
RBC: 4.96 x10E6/uL (ref 4.14–5.80)
RDW: 13.5 % (ref 12.3–15.4)
WBC: 4.9 10*3/uL (ref 3.4–10.8)

## 2017-09-27 LAB — LIPID PANEL
Chol/HDL Ratio: 5.3 ratio — ABNORMAL HIGH (ref 0.0–5.0)
Cholesterol, Total: 291 mg/dL — ABNORMAL HIGH (ref 100–199)
HDL: 55 mg/dL (ref >39)
LDL CALC: 201 mg/dL — AB (ref 0–99)
TRIGLYCERIDES: 176 mg/dL — AB (ref 0–149)
VLDL CHOLESTEROL CAL: 35 mg/dL (ref 5–40)

## 2017-09-27 LAB — CMP14+EGFR
ALBUMIN: 4.5 g/dL (ref 3.5–5.5)
ALT: 38 IU/L (ref 0–44)
AST: 26 IU/L (ref 0–40)
Albumin/Globulin Ratio: 1.7 (ref 1.2–2.2)
Alkaline Phosphatase: 73 IU/L (ref 39–117)
BUN / CREAT RATIO: 19 (ref 9–20)
BUN: 15 mg/dL (ref 6–24)
Bilirubin Total: 0.5 mg/dL (ref 0.0–1.2)
CALCIUM: 9.5 mg/dL (ref 8.7–10.2)
CO2: 23 mmol/L (ref 20–29)
CREATININE: 0.81 mg/dL (ref 0.76–1.27)
Chloride: 102 mmol/L (ref 96–106)
GFR, EST AFRICAN AMERICAN: 123 mL/min/{1.73_m2} (ref >59)
GFR, EST NON AFRICAN AMERICAN: 107 mL/min/{1.73_m2} (ref >59)
GLOBULIN, TOTAL: 2.6 g/dL (ref 1.5–4.5)
Glucose: 106 mg/dL — ABNORMAL HIGH (ref 65–99)
POTASSIUM: 5.1 mmol/L (ref 3.5–5.2)
SODIUM: 138 mmol/L (ref 134–144)
TOTAL PROTEIN: 7.1 g/dL (ref 6.0–8.5)

## 2017-09-27 LAB — URIC ACID: Uric Acid: 8.7 mg/dL — ABNORMAL HIGH (ref 3.7–8.6)

## 2017-09-27 LAB — TSH: TSH: 1.64 u[IU]/mL (ref 0.450–4.500)

## 2017-09-30 ENCOUNTER — Encounter: Payer: Self-pay | Admitting: Family Medicine

## 2017-09-30 ENCOUNTER — Ambulatory Visit (INDEPENDENT_AMBULATORY_CARE_PROVIDER_SITE_OTHER): Payer: 59 | Admitting: Family Medicine

## 2017-09-30 VITALS — BP 176/107 | HR 76 | Temp 97.2°F | Ht 69.0 in | Wt 219.0 lb

## 2017-09-30 DIAGNOSIS — Z0000 Encounter for general adult medical examination without abnormal findings: Secondary | ICD-10-CM | POA: Diagnosis not present

## 2017-09-30 DIAGNOSIS — I10 Essential (primary) hypertension: Secondary | ICD-10-CM

## 2017-09-30 DIAGNOSIS — M109 Gout, unspecified: Secondary | ICD-10-CM

## 2017-09-30 MED ORDER — COLCHICINE 0.6 MG PO TABS: 0.6000 mg | ORAL_TABLET | Freq: Every day | ORAL | 5 refills | Status: DC

## 2017-09-30 MED ORDER — ALLOPURINOL 300 MG PO TABS: 300.0000 mg | ORAL_TABLET | Freq: Every day | ORAL | 6 refills | Status: DC

## 2017-09-30 MED ORDER — EZETIMIBE 10 MG PO TABS: 10.0000 mg | ORAL_TABLET | Freq: Every day | ORAL | 5 refills | Status: DC

## 2017-09-30 NOTE — Progress Notes (Signed)
   HPI  Patient presents today for annual physical exam  Patient continues to have issues with gout.  He states that he has right knee pain and leg pain that was previously explained to be gout after arthrocentesis by orthopedics.  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  He does not really watch his diet. He has intermittent right leg and right knee pain he attributes to gout as above.  He was previously very well controlled on allopurinol and had mild LFT elevation in the 50s, he stopped the medication due to this. He now uses colchicine when needed for severe pain.  He has history of severe hyperlipidemia, however he is tried multiple statins including livalo, pravastatin, Lipitor all with arthralgias.  PMH: Smoking status noted ROS: Per HPI  Objective: BP (!) 176/107   Pulse 76   Temp (!) 97.2 F (36.2 C) (Oral)   Ht '5\' 9"'$  (1.753 m)   Wt 219 lb (99.3 kg)   BMI 32.34 kg/m  Gen: NAD, alert, cooperative with exam HEENT: NCAT, EOMI, PERRL CV: RRR, good S1/S2, no murmur Resp: CTABL, no wheezes, non-labored Abd: SNTND, BS present, no guarding or organomegaly Ext: No edema, warm Neuro: Alert and oriented, No gross deficits  Assessment and plan:  #Annual physical exam Normal exam except for weight Labs reviewed, hyperlipidemia- not helped significantly by pravastatin which is causing side effects.  We will stop this and restart Zetia in 1 month after he has been able to titrate allopurinol and has had repeat LFTs  #Gout Frequently active Allopurinol 150 x 2 weeks, then 300 mg daily, recheck LFTs in 4 weeks and uric acid.  Recommended colchicine 1 pill once daily during titration, may stop after 1 month.   #Hypertension Very elevated today, however blood pressures at home are typically in the 130s, believe he has a significant portion of whitecoat hypertension.  F/u 3 months, should start zetia in 1 month after LFTs are checked and titrated  on allopurinol   Orders Placed This Encounter  Procedures  . Bayer DCA Hb A1c Waived  . Uric acid    Standing Status:   Future    Standing Expiration Date:   10/01/2018  . CMP14+EGFR    Standing Status:   Future    Standing Expiration Date:   10/01/2018    Meds ordered this encounter  Medications  . allopurinol (ZYLOPRIM) 300 MG tablet    Sig: Take 1 tablet (300 mg total) by mouth daily.    Dispense:  30 tablet    Refill:  6  . colchicine 0.6 MG tablet    Sig: Take 1 tablet (0.6 mg total) by mouth daily.    Dispense:  30 tablet    Refill:  5  . ezetimibe (ZETIA) 10 MG tablet    Sig: Take 1 tablet (10 mg total) by mouth daily.    Dispense:  30 tablet    Refill:  [place], MD [facility] 09/30/2017, 10:20 AM

## 2017-09-30 NOTE — Patient Instructions (Signed)
Great to see you!  Stop pravastatin Start 1/2 pill allopurinol for 2 weeks then increase to 1 pill daily. Take colchicine 1 pill daily for 1 month  In 1 month come back for repeat labs- these are ordered  Then start zetia.   Follow up with Dr. Warrick Parisian in 3 months

## 2017-10-04 ENCOUNTER — Telehealth: Payer: Self-pay | Admitting: Family Medicine

## 2017-10-04 MED ORDER — COLCHICINE 0.6 MG PO TABS: 0.6000 mg | ORAL_TABLET | Freq: Two times a day (BID) | ORAL | 5 refills | Status: DC

## 2017-10-04 NOTE — Telephone Encounter (Signed)
Asked to call by family member, not available and mailbox is full.   Michael Apple, MD [facility] 10/04/2017, 3:05 PM

## 2017-10-04 NOTE — Telephone Encounter (Signed)
Discussed with pt  Leg pain better off pravastatin.   Had foot pain/gout flare after single dose of allopurinol. Recommend starting colchicine BID and start allopurinol on Friday after a couple of days of colchicine. Continue BID X 1-2 months.   Laroy Apple, MD [facility] 10/04/2017, 5:04 PM

## 2017-10-14 ENCOUNTER — Other Ambulatory Visit: Payer: Self-pay | Admitting: Family Medicine

## 2017-10-14 MED ORDER — COLCHICINE 0.6 MG PO CAPS: 0.6000 mg | ORAL_CAPSULE | Freq: Two times a day (BID) | ORAL | 5 refills | Status: DC

## 2017-10-14 NOTE — Telephone Encounter (Signed)
Change to mitigare for coverage.   Laroy Apple, MD [facility] 10/14/2017, 3:12 PM

## 2017-10-20 ENCOUNTER — Telehealth: Payer: Self-pay

## 2017-10-20 ENCOUNTER — Encounter: Payer: 59 | Admitting: Family Medicine

## 2017-10-20 MED ORDER — COLCHICINE-PROBENECID 0.5-500 MG PO TABS
1.0000 | ORAL_TABLET | Freq: Two times a day (BID) | ORAL | 2 refills | Status: DC
Start: 2017-10-20 — End: 2017-10-21

## 2017-10-20 MED FILL — PROBENECID-COLCHICINE TABS: 0.5-500 | 30 days supply | Qty: 60 | Fill #0

## 2017-10-20 NOTE — Telephone Encounter (Signed)
They know

## 2017-10-20 NOTE — Telephone Encounter (Signed)
Will try colchicine/probenecid for coverage reasons.   Laroy Apple, MD [facility] 10/20/2017, 11:32 AM

## 2017-10-20 NOTE — Telephone Encounter (Signed)
Insurance denied prior auth for [place]  Must try and fail generic Mitigare or generic colchicine

## 2017-10-21 ENCOUNTER — Telehealth: Payer: Self-pay | Admitting: Family Medicine

## 2017-10-21 MED ORDER — MELOXICAM 15 MG PO TABS: 15.0000 mg | ORAL_TABLET | Freq: Every day | ORAL | 1 refills | Status: DC

## 2017-10-21 MED FILL — SHIPPING COST: 1 days supply | Qty: 1 | Fill #4

## 2017-10-21 NOTE — Telephone Encounter (Signed)
Discussed with his wife his situation.  Colchicine, Mitigare, and colchicine/probenecid have been too expensive.  Patient has had gout flares with single doses of allopurinol.  I think for his long-term improvement allopurinol is the best thing for him, however it is difficult to find something that will help him not have the initial flares.  I like the idea of changing amlodipine to losartan, however I do not believe in his case they will be potent enough as a uricosuric agent to prevent flares.  Will start meloxicam daily x2 months while he starts allopurinol.  Discussed with his wife to avoid NSAIDs while taking meloxicam.  Laroy Apple, MD [facility] 10/21/2017, 2:10 PM

## 2017-10-31 MED FILL — SHIPPING COST: 1 days supply | Qty: 1 | Fill #5

## 2017-10-31 MED FILL — NADOLOL 40 MG TABLET: 40 | 90 days supply | Qty: 90 | Fill #2

## 2017-12-27 ENCOUNTER — Telehealth: Payer: Self-pay | Admitting: *Deleted

## 2017-12-27 MED ORDER — MELOXICAM 15 MG PO TABS: 15.0000 mg | ORAL_TABLET | Freq: Every day | ORAL | 0 refills | Status: DC

## 2017-12-27 MED ORDER — AMLODIPINE BESYLATE 5 MG PO TABS: 5.0000 mg | ORAL_TABLET | Freq: Every day | ORAL | 0 refills | Status: DC

## 2017-12-27 MED FILL — MELOXICAM 15 MG TABLET: 15 | 30 days supply | Qty: 30 | Fill #0

## 2017-12-27 MED FILL — SHIPPING COST: 1 days supply | Qty: 1 | Fill #6

## 2017-12-27 MED FILL — AMLODIPINE BESYLATE 5 MG TA: 5 | 90 days supply | Qty: 90 | Fill #0

## 2017-12-27 NOTE — Telephone Encounter (Signed)
Refill sent to [facility] appt made w/ Dr. Warrick Parisian

## 2018-01-02 ENCOUNTER — Ambulatory Visit (INDEPENDENT_AMBULATORY_CARE_PROVIDER_SITE_OTHER): Payer: 59 | Admitting: Family Medicine

## 2018-01-02 ENCOUNTER — Encounter: Payer: Self-pay | Admitting: Family Medicine

## 2018-01-02 VITALS — BP 183/102 | HR 78 | Temp 97.9°F | Ht 69.0 in | Wt 219.6 lb

## 2018-01-02 DIAGNOSIS — E782 Mixed hyperlipidemia: Secondary | ICD-10-CM

## 2018-01-02 DIAGNOSIS — I10 Essential (primary) hypertension: Secondary | ICD-10-CM | POA: Diagnosis not present

## 2018-01-02 DIAGNOSIS — K219 Gastro-esophageal reflux disease without esophagitis: Secondary | ICD-10-CM | POA: Diagnosis not present

## 2018-01-02 DIAGNOSIS — G5761 Lesion of plantar nerve, right lower limb: Secondary | ICD-10-CM | POA: Diagnosis not present

## 2018-01-02 LAB — BMP8+EGFR
BUN / CREAT RATIO: 16 (ref 9–20)
BUN: 14 mg/dL (ref 6–24)
CHLORIDE: 103 mmol/L (ref 96–106)
CO2: 23 mmol/L (ref 20–29)
Calcium: 9.4 mg/dL (ref 8.7–10.2)
Creatinine, Ser: 0.88 mg/dL (ref 0.76–1.27)
GFR calc Af Amer: 118 mL/min/{1.73_m2} (ref >59)
GFR calc non Af Amer: 102 mL/min/{1.73_m2} (ref >59)
Glucose: 104 mg/dL — ABNORMAL HIGH (ref 65–99)
Potassium: 4.6 mmol/L (ref 3.5–5.2)
SODIUM: 142 mmol/L (ref 134–144)

## 2018-01-02 LAB — LIPID PANEL
Chol/HDL Ratio: 6.5 ratio — ABNORMAL HIGH (ref 0.0–5.0)
Cholesterol, Total: 324 mg/dL — ABNORMAL HIGH (ref 100–199)
HDL: 50 mg/dL (ref >39)
LDL Calculated: 242 mg/dL — ABNORMAL HIGH (ref 0–99)
Triglycerides: 161 mg/dL — ABNORMAL HIGH (ref 0–149)
VLDL CHOLESTEROL CAL: 32 mg/dL (ref 5–40)

## 2018-01-02 MED ORDER — MELOXICAM 15 MG PO TABS: 15.0000 mg | ORAL_TABLET | Freq: Every day | ORAL | 3 refills | Status: DC

## 2018-01-02 MED ORDER — AMLODIPINE BESYLATE 5 MG PO TABS: 5.0000 mg | ORAL_TABLET | Freq: Two times a day (BID) | ORAL | 3 refills | Status: DC

## 2018-01-02 NOTE — Progress Notes (Signed)
BP (!) 183/102   Pulse 78   Temp 97.9 F (36.6 C) (Oral)   Ht 5' 9"  (1.753 m)   Wt 219 lb 9.6 oz (99.6 kg)   BMI 32.43 kg/m    Subjective:    Patient ID: Michael Hays, male    DOB: [birth year], 47 y.o.   MRN: [id number]  HPI: STANCIL DEISHER is a 47 y.o. male presenting on 01/02/2018 for Hypertension (3 month follow up); Hyperlipidemia; Establish Care Wendi Snipes pt); and Numbness (Bottom Left foot x 1 month )   HPI Hypertension Patient is currently on amlodipine, and their blood pressure today is 183/102, he says it will be up in the morning but then it comes down in the afternoon.  He says he took his medication around 6:00 this morning but takes the nadolol in the evening. Patient denies any lightheadedness or dizziness. Patient denies headaches, blurred vision, chest pains, shortness of breath, or weakness. Denies any side effects from medication and is content with current medication.   Hyperlipidemia Patient is coming in for recheck of his hyperlipidemia. The patient is currently taking no medication currently but has been discussed getting him on Zetia, will check labs today first. They deny any issues with myalgias or history of liver damage from it. They deny any focal numbness or weakness or chest pain.   GERD Patient is currently on no medication currently and denies any issues.  She denies any major symptoms or abdominal pain or belching or burping. She denies any blood in her stool or lightheadedness or dizziness.   Pain and a ball sensation in his foot Pain and a ball sensation in the bottom of his foot in the middle.  He says is been having this gradually over the past couple months and also sometimes feels a numbness sensation through there with the pain and feels like he is walking on a ball in the middle of his foot.  He says the pain will come and go and is not really bad today but gets worse when he is on his feet for long periods of time.  Relevant past medical,  surgical, family and social history reviewed and updated as indicated. Interim medical history since our last visit reviewed. Allergies and medications reviewed and updated.  Review of Systems  Constitutional: Negative for chills and fever.  Eyes: Negative for visual disturbance.  Respiratory: Negative for shortness of breath and wheezing.   Cardiovascular: Negative for chest pain and leg swelling.  Musculoskeletal: Positive for arthralgias. Negative for back pain and gait problem.  Skin: Negative for rash.  Neurological: Negative for dizziness, weakness, light-headedness and headaches.  All other systems reviewed and are negative.   Per HPI unless specifically indicated above   Allergies as of 01/02/2018      Reactions   Atenolol    Codeine    Hydrocodone-acetaminophen       Medication List        Accurate as of 01/02/18  8:25 AM. Always use your most recent med list.          amLODipine 5 MG tablet Commonly known as:  NORVASC Take 1 tablet (5 mg total) by mouth 2 (two) times daily.   aspirin 325 MG tablet Take 325 mg by mouth daily.   ezetimibe 10 MG tablet Commonly known as:  ZETIA Take 1 tablet (10 mg total) by mouth daily.   meloxicam 15 MG tablet Commonly known as:  MOBIC Take 1 tablet (15 mg total)  by mouth daily.   nadolol 40 MG tablet Commonly known as:  CORGARD TAKE ONE TABLET BY MOUTH EVERY DAY          Objective:    BP (!) 183/102   Pulse 78   Temp 97.9 F (36.6 C) (Oral)   Ht 5' 9"  (1.753 m)   Wt 219 lb 9.6 oz (99.6 kg)   BMI 32.43 kg/m   Wt Readings from Last 3 Encounters:  01/02/18 219 lb 9.6 oz (99.6 kg)  09/30/17 219 lb (99.3 kg)  04/20/17 217 lb 12.8 oz (98.8 kg)    Physical Exam  Constitutional: He is oriented to person, place, and time. He appears well-developed and well-nourished. No distress.  Eyes: Conjunctivae are normal. No scleral icterus.  Neck: Neck supple. No thyromegaly present.  Cardiovascular: Normal rate, regular  rhythm, normal heart sounds and intact distal pulses.  No murmur heard. Pulmonary/Chest: Effort normal and breath sounds normal. No respiratory distress. He has no wheezes.  Musculoskeletal: Normal range of motion. He exhibits no edema.       Feet:  Lymphadenopathy:    He has no cervical adenopathy.  Neurological: He is alert and oriented to person, place, and time. Coordination normal.  Skin: Skin is warm and dry. No rash noted. He is not diaphoretic.  Psychiatric: He has a normal mood and affect. His behavior is normal.  Nursing note and vitals reviewed.       Assessment & Plan:   Problem List Items Addressed This Visit      Cardiovascular and Mediastinum   Essential hypertension   Relevant Medications   amLODipine (NORVASC) 5 MG tablet   Other Relevant Orders   BMP8+EGFR     Digestive   GERD     Other   HLD (hyperlipidemia) - Primary   Relevant Medications   amLODipine (NORVASC) 5 MG tablet   Other Relevant Orders   Lipid panel    Other Visit Diagnoses    Morton's neuroma of right foot        Recommended inserts and anti-inflammatories and icing, return if he needs a podiatry referral for this.  Follow up plan: Return if symptoms worsen or fail to improve, for Hypertension and cholesterol and GERD.  Counseling provided for all of the vaccine components Orders Placed This Encounter  Procedures  . BMP8+EGFR  . Lipid panel    Caryl Pina, MD [facility] 01/02/2018, 8:25 AM

## 2018-01-30 ENCOUNTER — Other Ambulatory Visit: Payer: Self-pay

## 2018-01-30 MED ORDER — MELOXICAM 15 MG PO TABS: 15.0000 mg | ORAL_TABLET | Freq: Every day | ORAL | 1 refills | Status: DC

## 2018-01-30 MED ORDER — MELOXICAM 15 MG PO TABS: 15.0000 mg | ORAL_TABLET | Freq: Every day | ORAL | 0 refills | Status: DC

## 2018-01-30 MED FILL — SHIPPING COST: 1 days supply | Qty: 1 | Fill #7

## 2018-01-30 MED FILL — MELOXICAM 15 MG TABLET: 15 | 90 days supply | Qty: 90 | Fill #0

## 2018-02-09 MED FILL — SHIPPING COST: 1 days supply | Qty: 1 | Fill #8

## 2018-02-09 MED FILL — NADOLOL 40 MG TABLET: 40 | 90 days supply | Qty: 90 | Fill #3

## 2018-02-13 ENCOUNTER — Other Ambulatory Visit: Payer: Self-pay | Admitting: *Deleted

## 2018-02-13 MED ORDER — EZETIMIBE 10 MG PO TABS: 10.0000 mg | ORAL_TABLET | Freq: Every day | ORAL | 0 refills | Status: DC

## 2018-02-13 MED FILL — EZETIMIBE 10 MG TABS: 10 | 90 days supply | Qty: 90 | Fill #0

## 2018-02-13 MED FILL — SHIPPING COST: 1 days supply | Qty: 1 | Fill #9

## 2018-03-31 ENCOUNTER — Other Ambulatory Visit: Payer: Self-pay | Admitting: *Deleted

## 2018-03-31 DIAGNOSIS — I10 Essential (primary) hypertension: Secondary | ICD-10-CM

## 2018-03-31 MED ORDER — AMLODIPINE BESYLATE 5 MG PO TABS: 5.0000 mg | ORAL_TABLET | Freq: Two times a day (BID) | ORAL | 1 refills | Status: DC

## 2018-03-31 MED FILL — AMLODIPINE BESYLATE 5 MG TA: 5 | 90 days supply | Qty: 180 | Fill #0

## 2018-03-31 MED FILL — SHIPPING COST: 1 days supply | Qty: 1 | Fill #10

## 2018-04-10 ENCOUNTER — Ambulatory Visit (INDEPENDENT_AMBULATORY_CARE_PROVIDER_SITE_OTHER): Payer: 59 | Admitting: Family Medicine

## 2018-04-10 ENCOUNTER — Encounter: Payer: Self-pay | Admitting: Family Medicine

## 2018-04-10 VITALS — BP 184/102 | HR 78 | Temp 97.7°F | Ht 69.0 in | Wt 226.0 lb

## 2018-04-10 DIAGNOSIS — E782 Mixed hyperlipidemia: Secondary | ICD-10-CM | POA: Diagnosis not present

## 2018-04-10 DIAGNOSIS — I10 Essential (primary) hypertension: Secondary | ICD-10-CM

## 2018-04-10 DIAGNOSIS — K219 Gastro-esophageal reflux disease without esophagitis: Secondary | ICD-10-CM

## 2018-04-10 LAB — LIPID PANEL
CHOL/HDL RATIO: 5.7 ratio — AB (ref 0.0–5.0)
Cholesterol, Total: 302 mg/dL — ABNORMAL HIGH (ref 100–199)
HDL: 53 mg/dL (ref >39)
LDL CALC: 213 mg/dL — AB (ref 0–99)
Triglycerides: 178 mg/dL — ABNORMAL HIGH (ref 0–149)
VLDL CHOLESTEROL CAL: 36 mg/dL (ref 5–40)

## 2018-04-10 MED ORDER — LISINOPRIL 10 MG PO TABS: 10.0000 mg | ORAL_TABLET | Freq: Every day | ORAL | 3 refills | Status: DC

## 2018-04-10 MED ORDER — EZETIMIBE 10 MG PO TABS: 10.0000 mg | ORAL_TABLET | Freq: Every day | ORAL | 3 refills | Status: DC

## 2018-04-10 NOTE — Progress Notes (Signed)
BP (!) 184/102   Pulse 78   Temp 97.7 F (36.5 C) (Oral)   Ht 5\' 9"  (1.753 m)   Wt 226 lb (102.5 kg)   BMI 33.37 kg/m    Subjective:    Patient ID: Michael Hays, male    DOB: 15-Jul-1970, 48 y.o.   MRN: [id number]  HPI: Michael Hays is a 48 y.o. male presenting on 04/10/2018 for Hyperlipidemia (3 month followup)   HPI Hypertension Patient is currently on amlodipine and nadolol, and their blood pressure today is 184/102. Patient denies any lightheadedness or dizziness. Patient denies headaches, blurred vision, chest pains, shortness of breath, or weakness. Denies any side effects from medication and is content with current medication.   Hyperlipidemia Patient is coming in for recheck of his hyperlipidemia. The patient is currently taking Zetia. They deny any issues with myalgias or history of liver damage from it. They deny any focal numbness or weakness or chest pain.   GERD Patient is currently on nothing currently but we are monitoring.  She denies any major symptoms or abdominal pain or belching or burping. She denies any blood in her stool or lightheadedness or dizziness.   Relevant past medical, surgical, family and social history reviewed and updated as indicated. Interim medical history since our last visit reviewed. Allergies and medications reviewed and updated.  Review of Systems  Constitutional: Negative for chills and fever.  Eyes: Negative for visual disturbance.  Respiratory: Negative for shortness of breath and wheezing.   Cardiovascular: Negative for chest pain and leg swelling.  Gastrointestinal: Negative for abdominal pain, diarrhea, nausea and vomiting.  Musculoskeletal: Negative for back pain and gait problem.  Skin: Negative for rash.  Neurological: Negative for dizziness, weakness and light-headedness.  All other systems reviewed and are negative.   Per HPI unless specifically indicated above   Allergies as of 04/10/2018      Reactions   Atenolol      Codeine    Hydrocodone-acetaminophen       Medication List       Accurate as of April 10, 2018  8:20 AM. Always use your most recent med list.        amLODipine 5 MG tablet Commonly known as:  NORVASC Take 1 tablet (5 mg total) by mouth 2 (two) times daily.   ezetimibe 10 MG tablet Commonly known as:  ZETIA Take 1 tablet (10 mg total) by mouth daily.   lisinopril 10 MG tablet Commonly known as:  PRINIVIL,ZESTRIL Take 1 tablet (10 mg total) by mouth at bedtime.   meloxicam 15 MG tablet Commonly known as:  MOBIC Take 1 tablet (15 mg total) by mouth daily.   nadolol 40 MG tablet Commonly known as:  CORGARD TAKE ONE TABLET BY MOUTH EVERY DAY          Objective:    BP (!) 184/102   Pulse 78   Temp 97.7 F (36.5 C) (Oral)   Ht 5\' 9"  (1.753 m)   Wt 226 lb (102.5 kg)   BMI 33.37 kg/m   Wt Readings from Last 3 Encounters:  04/10/18 226 lb (102.5 kg)  01/02/18 219 lb 9.6 oz (99.6 kg)  09/30/17 219 lb (99.3 kg)    Physical Exam Vitals signs and nursing note reviewed.  Constitutional:      General: He is not in acute distress.    Appearance: He is well-developed. He is not diaphoretic.  Eyes:     General: No scleral icterus.  Conjunctiva/sclera: Conjunctivae normal.  Neck:     Musculoskeletal: Neck supple.     Thyroid: No thyromegaly.  Cardiovascular:     Rate and Rhythm: Normal rate and regular rhythm.     Heart sounds: Normal heart sounds. No murmur.  Pulmonary:     Effort: Pulmonary effort is normal. No respiratory distress.     Breath sounds: Normal breath sounds. No wheezing.  Musculoskeletal: Normal range of motion.  Lymphadenopathy:     Cervical: No cervical adenopathy.  Skin:    General: Skin is warm and dry.     Findings: No rash.  Neurological:     Mental Status: He is alert and oriented to person, place, and time.     Coordination: Coordination normal.  Psychiatric:        Behavior: Behavior normal.         Assessment & Plan:    Problem List Items Addressed This Visit      Cardiovascular and Mediastinum   Essential hypertension - Primary   Relevant Medications   ezetimibe (ZETIA) 10 MG tablet   lisinopril (PRINIVIL,ZESTRIL) 10 MG tablet     Digestive   GERD     Other   HLD (hyperlipidemia)   Relevant Medications   ezetimibe (ZETIA) 10 MG tablet   lisinopril (PRINIVIL,ZESTRIL) 10 MG tablet   Other Relevant Orders   Lipid panel       Follow up plan: Return in about 4 weeks (around 05/08/2018), or if symptoms worsen or fail to improve, for Hypertension.  Counseling provided for all of the vaccine components Orders Placed This Encounter  Procedures  . Lipid panel    Caryl Pina, MD [facility] 04/10/2018, 8:20 AM

## 2018-04-12 ENCOUNTER — Other Ambulatory Visit: Payer: Self-pay

## 2018-04-12 DIAGNOSIS — E782 Mixed hyperlipidemia: Secondary | ICD-10-CM

## 2018-05-08 MED FILL — MELOXICAM 15 MG TABLET: 15 | 90 days supply | Qty: 90 | Fill #1

## 2018-05-09 MED FILL — SHIPPING COST: 1 days supply | Qty: 1 | Fill #11

## 2018-05-11 ENCOUNTER — Encounter: Payer: Self-pay | Admitting: Family Medicine

## 2018-05-11 ENCOUNTER — Ambulatory Visit (INDEPENDENT_AMBULATORY_CARE_PROVIDER_SITE_OTHER): Payer: Commercial Managed Care - PPO | Admitting: Family Medicine

## 2018-05-11 VITALS — BP 153/91 | HR 83 | Temp 98.8°F | Ht 69.0 in | Wt 225.0 lb

## 2018-05-11 DIAGNOSIS — I10 Essential (primary) hypertension: Secondary | ICD-10-CM

## 2018-05-11 DIAGNOSIS — E782 Mixed hyperlipidemia: Secondary | ICD-10-CM | POA: Diagnosis not present

## 2018-05-11 MED ORDER — MELOXICAM 15 MG PO TABS: 15.0000 mg | ORAL_TABLET | Freq: Every day | ORAL | 3 refills | Status: DC

## 2018-05-11 MED ORDER — NADOLOL 40 MG PO TABS: ORAL_TABLET | ORAL | 3 refills | Status: DC

## 2018-05-11 MED ORDER — EZETIMIBE 10 MG PO TABS: 10.0000 mg | ORAL_TABLET | Freq: Every day | ORAL | 3 refills | Status: DC

## 2018-05-11 NOTE — Progress Notes (Signed)
BP (!) 153/91   Pulse 83   Temp 98.8 F (37.1 C) (Oral)   Ht 5\' 9"  (1.753 m)   Wt 225 lb (102.1 kg)   BMI 33.23 kg/m    Subjective:    Patient ID: Michael Hays, male    DOB: [birth year], 48 y.o.   MRN: [id number]  HPI: Michael Hays is a 48 y.o. male presenting on 05/11/2018 for Hypertension (1 month follow up)   HPI Hypertension Patient is currently on amlodipine and lisinopril in the nadolol, and their blood pressure today is 153/91 but he says at home it is running in the 120s and 1 teens over 44s and 80s. Patient denies any lightheadedness or dizziness. Patient denies headaches, blurred vision, chest pains, shortness of breath, or weakness. Denies any side effects from medication and is content with current medication.   Hyperlipidemia Patient is coming in for recheck of his hyperlipidemia. The patient is currently taking Zetia, he was not taking it every day but now he is taking it every day and is tolerating it. They deny any issues with myalgias or history of liver damage from it. They deny any focal numbness or weakness or chest pain.   Relevant past medical, surgical, family and social history reviewed and updated as indicated. Interim medical history since our last visit reviewed. Allergies and medications reviewed and updated.  Review of Systems  Constitutional: Negative for chills and fever.  Eyes: Negative for visual disturbance.  Respiratory: Negative for shortness of breath and wheezing.   Cardiovascular: Negative for chest pain and leg swelling.  Musculoskeletal: Negative for back pain and gait problem.  Skin: Negative for rash.  Neurological: Negative for dizziness, weakness, light-headedness and numbness.  All other systems reviewed and are negative.   Per HPI unless specifically indicated above   Allergies as of 05/11/2018      Reactions   Atenolol    Codeine    Hydrocodone-acetaminophen       Medication List       Accurate as of May 11, 2018   8:22 AM. Always use your most recent med list.        amLODipine 5 MG tablet Commonly known as:  NORVASC Take 1 tablet (5 mg total) by mouth 2 (two) times daily.   ezetimibe 10 MG tablet Commonly known as:  ZETIA Take 1 tablet (10 mg total) by mouth daily.   lisinopril 10 MG tablet Commonly known as:  PRINIVIL,ZESTRIL Take 1 tablet (10 mg total) by mouth at bedtime.   meloxicam 15 MG tablet Commonly known as:  MOBIC Take 1 tablet (15 mg total) by mouth daily.   nadolol 40 MG tablet Commonly known as:  CORGARD TAKE ONE TABLET BY MOUTH EVERY DAY          Objective:    BP (!) 153/91   Pulse 83   Temp 98.8 F (37.1 C) (Oral)   Ht 5\' 9"  (1.753 m)   Wt 225 lb (102.1 kg)   BMI 33.23 kg/m   Wt Readings from Last 3 Encounters:  05/11/18 225 lb (102.1 kg)  04/10/18 226 lb (102.5 kg)  01/02/18 219 lb 9.6 oz (99.6 kg)    Physical Exam Vitals signs and nursing note reviewed.  Constitutional:      General: He is not in acute distress.    Appearance: He is well-developed. He is not diaphoretic.  Eyes:     General: No scleral icterus.    Conjunctiva/sclera: Conjunctivae normal.  Neck:     Musculoskeletal: Neck supple.     Thyroid: No thyromegaly.  Cardiovascular:     Rate and Rhythm: Normal rate and regular rhythm.     Heart sounds: Normal heart sounds. No murmur.  Pulmonary:     Effort: Pulmonary effort is normal. No respiratory distress.     Breath sounds: Normal breath sounds. No wheezing.  Musculoskeletal: Normal range of motion.  Lymphadenopathy:     Cervical: No cervical adenopathy.  Skin:    General: Skin is warm and dry.     Findings: No rash.  Neurological:     Mental Status: He is alert and oriented to person, place, and time.     Coordination: Coordination normal.  Psychiatric:        Behavior: Behavior normal.        Assessment & Plan:   Problem List Items Addressed This Visit      Cardiovascular and Mediastinum   Essential hypertension -  Primary   Relevant Medications   ezetimibe (ZETIA) 10 MG tablet   nadolol (CORGARD) 40 MG tablet     Other   HLD (hyperlipidemia)   Relevant Medications   ezetimibe (ZETIA) 10 MG tablet   nadolol (CORGARD) 40 MG tablet       Follow up plan: Return if symptoms worsen or fail to improve.  Counseling provided for all of the vaccine components No orders of the defined types were placed in this encounter.   Caryl Pina, MD [facility] 05/11/2018, 8:22 AM

## 2018-05-15 ENCOUNTER — Telehealth: Payer: Self-pay | Admitting: *Deleted

## 2018-05-15 DIAGNOSIS — I10 Essential (primary) hypertension: Secondary | ICD-10-CM

## 2018-05-15 MED ORDER — NADOLOL 40 MG PO TABS: ORAL_TABLET | ORAL | 1 refills | Status: DC

## 2018-05-15 MED FILL — SHIPPING COST: 1 days supply | Qty: 1 | Fill #0

## 2018-05-15 MED FILL — NADOLOL 40 MG TABLET: 40 | 90 days supply | Qty: 90 | Fill #0

## 2018-05-15 NOTE — Telephone Encounter (Signed)
Aware refill sent to pharmacy ?

## 2018-05-30 ENCOUNTER — Ambulatory Visit: Payer: 59 | Admitting: Internal Medicine

## 2018-06-27 ENCOUNTER — Other Ambulatory Visit: Payer: Self-pay | Admitting: Family Medicine

## 2018-06-27 DIAGNOSIS — E782 Mixed hyperlipidemia: Secondary | ICD-10-CM

## 2018-06-28 MED FILL — EZETIMIBE 10 MG TABS: 10 | 90 days supply | Qty: 90 | Fill #0

## 2018-08-08 ENCOUNTER — Other Ambulatory Visit: Payer: Self-pay

## 2018-08-08 NOTE — Telephone Encounter (Signed)
Patient has apt Friday and will run out of meloxicam before then- please advise if can be filled.

## 2018-08-09 MED ORDER — MELOXICAM 15 MG PO TABS: 15.0000 mg | ORAL_TABLET | Freq: Every day | ORAL | 3 refills | Status: DC

## 2018-08-09 MED FILL — NADOLOL 40 MG TABLET: 40 | 90 days supply | Qty: 90 | Fill #1

## 2018-08-09 MED FILL — MELOXICAM 15 MG TABLET: 15 | 90 days supply | Qty: 90 | Fill #0

## 2018-08-10 ENCOUNTER — Ambulatory Visit: Payer: Commercial Managed Care - PPO | Admitting: Family Medicine

## 2018-08-11 ENCOUNTER — Other Ambulatory Visit: Payer: Self-pay

## 2018-08-11 ENCOUNTER — Encounter: Payer: Self-pay | Admitting: Family Medicine

## 2018-08-11 ENCOUNTER — Ambulatory Visit (INDEPENDENT_AMBULATORY_CARE_PROVIDER_SITE_OTHER): Payer: 59 | Admitting: Family Medicine

## 2018-08-11 VITALS — BP 162/98 | HR 75 | Temp 97.0°F | Ht 69.0 in | Wt 225.2 lb

## 2018-08-11 DIAGNOSIS — Z9189 Other specified personal risk factors, not elsewhere classified: Secondary | ICD-10-CM

## 2018-08-11 DIAGNOSIS — E782 Mixed hyperlipidemia: Secondary | ICD-10-CM

## 2018-08-11 DIAGNOSIS — K219 Gastro-esophageal reflux disease without esophagitis: Secondary | ICD-10-CM

## 2018-08-11 DIAGNOSIS — I10 Essential (primary) hypertension: Secondary | ICD-10-CM | POA: Diagnosis not present

## 2018-08-11 DIAGNOSIS — M1711 Unilateral primary osteoarthritis, right knee: Secondary | ICD-10-CM

## 2018-08-11 LAB — LIPID PANEL
Chol/HDL Ratio: 5.8 ratio — ABNORMAL HIGH (ref 0.0–5.0)
Cholesterol, Total: 302 mg/dL — ABNORMAL HIGH (ref 100–199)
HDL: 52 mg/dL (ref >39)
LDL Calculated: 205 mg/dL — ABNORMAL HIGH (ref 0–99)
Triglycerides: 223 mg/dL — ABNORMAL HIGH (ref 0–149)
VLDL Cholesterol Cal: 45 mg/dL — ABNORMAL HIGH (ref 5–40)

## 2018-08-11 LAB — CBC WITH DIFFERENTIAL/PLATELET
Basophils Absolute: 0.1 10*3/uL (ref 0.0–0.2)
Basos: 1 %
EOS (ABSOLUTE): 0.1 10*3/uL (ref 0.0–0.4)
Eos: 2 %
Hematocrit: 42.6 % (ref 37.5–51.0)
Hemoglobin: 14.9 g/dL (ref 13.0–17.7)
Immature Grans (Abs): 0 10*3/uL (ref 0.0–0.1)
Immature Granulocytes: 0 %
Lymphocytes Absolute: 1.4 10*3/uL (ref 0.7–3.1)
Lymphs: 23 %
MCH: 29.9 pg (ref 26.6–33.0)
MCHC: 35 g/dL (ref 31.5–35.7)
MCV: 86 fL (ref 79–97)
Monocytes Absolute: 0.9 10*3/uL (ref 0.1–0.9)
Monocytes: 14 %
Neutrophils Absolute: 3.7 10*3/uL (ref 1.4–7.0)
Neutrophils: 60 %
Platelets: 244 10*3/uL (ref 150–450)
RBC: 4.98 x10E6/uL (ref 4.14–5.80)
RDW: 12.4 % (ref 11.6–15.4)
WBC: 6.2 10*3/uL (ref 3.4–10.8)

## 2018-08-11 LAB — CMP14+EGFR
ALT: 43 IU/L (ref 0–44)
AST: 33 IU/L (ref 0–40)
Albumin/Globulin Ratio: 1.8 (ref 1.2–2.2)
Albumin: 4.8 g/dL (ref 4.0–5.0)
Alkaline Phosphatase: 74 IU/L (ref 39–117)
BUN/Creatinine Ratio: 20 (ref 9–20)
BUN: 18 mg/dL (ref 6–24)
Bilirubin Total: 0.5 mg/dL (ref 0.0–1.2)
CO2: 21 mmol/L (ref 20–29)
Calcium: 10.4 mg/dL — ABNORMAL HIGH (ref 8.7–10.2)
Chloride: 99 mmol/L (ref 96–106)
Creatinine, Ser: 0.88 mg/dL (ref 0.76–1.27)
GFR calc Af Amer: 118 mL/min/{1.73_m2} (ref >59)
GFR calc non Af Amer: 102 mL/min/{1.73_m2} (ref >59)
Globulin, Total: 2.7 g/dL (ref 1.5–4.5)
Glucose: 105 mg/dL — ABNORMAL HIGH (ref 65–99)
Potassium: 5.1 mmol/L (ref 3.5–5.2)
Sodium: 136 mmol/L (ref 134–144)
Total Protein: 7.5 g/dL (ref 6.0–8.5)

## 2018-08-11 MED ORDER — NADOLOL 40 MG PO TABS: ORAL_TABLET | ORAL | 3 refills | Status: DC

## 2018-08-11 MED ORDER — AMLODIPINE BESYLATE 5 MG PO TABS: 5.0000 mg | ORAL_TABLET | Freq: Two times a day (BID) | ORAL | 3 refills | Status: DC

## 2018-08-11 MED ORDER — METHYLPREDNISOLONE ACETATE 80 MG/ML IJ SUSP
80.0000 mg | Freq: Once | INTRAMUSCULAR | Status: AC
  Administered 2018-08-11: 80 mg via INTRAMUSCULAR

## 2018-08-11 MED FILL — AMLODIPINE BESYLATE 5 MG TA: 5 | 90 days supply | Qty: 180 | Fill #0

## 2018-08-11 NOTE — Progress Notes (Addendum)
BP (!) 162/98   Pulse 75   Temp (!) 97 F (36.1 C) (Oral)   Ht 5' 9" (1.753 m)   Wt 225 lb 3.2 oz (102.2 kg)   BMI 33.26 kg/m    Subjective:   Patient ID: Michael Hays, male    DOB: April 03, 1971, 48 y.o.   MRN: [id number]  HPI: Michael Hays is a 48 y.o. male presenting on 08/11/2018 for Hyperlipidemia (3 month follow up) and Hypertension   HPI Hypertension Patient is currently on amlodipine and nadolol, and their blood pressure today is 162/98 but at home to this morning it was 124/82. Patient denies any lightheadedness or dizziness. Patient denies headaches, blurred vision, chest pains, shortness of breath, or weakness. Denies any side effects from medication and is content with current medication.   Hyperlipidemia Patient is coming in for recheck of his hyperlipidemia. The patient is currently taking Zetia which is new and we will check his labs today. They deny any issues with myalgias or history of liver damage from it. They deny any focal numbness or weakness or chest pain.   GERD Patient is currently on no medication currently and has been doing better with this.  She denies any major symptoms or abdominal pain or belching or burping. She denies any blood in her stool or lightheadedness or dizziness.   Right knee arthritis Patient has flat knees problems since he was 74 and has been told his gout and other things in his knee, he has had in 1 injection previously and has had issues with the inflammation more recently and he feels like he cannot bend all the way back because of the inflammation and the pressure in there.  Patient denies any fevers or chills or redness or warmth.  He does have gout in other places but the gout medication do not seem to help as much with the knee.  Relevant past medical, surgical, family and social history reviewed and updated as indicated. Interim medical history since our last visit reviewed. Allergies and medications reviewed and updated.   Review of Systems  Constitutional: Negative for chills and fever.  Respiratory: Negative for shortness of breath and wheezing.   Cardiovascular: Negative for chest pain and leg swelling.  Gastrointestinal: Negative for abdominal pain.  Musculoskeletal: Positive for arthralgias and joint swelling. Negative for back pain and gait problem.  Skin: Negative for rash.  Neurological: Negative for dizziness and light-headedness.  All other systems reviewed and are negative.   Per HPI unless specifically indicated above   Allergies as of 08/11/2018      Reactions   Atenolol    Codeine    Hydrocodone-acetaminophen       Medication List       Accurate as of Aug 11, 2018  9:46 AM. If you have any questions, ask your nurse or doctor.        amLODipine 5 MG tablet Commonly known as:  NORVASC Take 1 tablet (5 mg total) by mouth 2 (two) times daily.   ezetimibe 10 MG tablet Commonly known as:  ZETIA TAKE 1 TABLET BY MOUTH ONCE DAILY   lisinopril 10 MG tablet Commonly known as:  ZESTRIL Take 1 tablet (10 mg total) by mouth at bedtime.   meloxicam 15 MG tablet Commonly known as:  MOBIC Take 1 tablet (15 mg total) by mouth daily.   nadolol 40 MG tablet Commonly known as:  CORGARD TAKE ONE TABLET BY MOUTH EVERY DAY  Objective:   BP (!) 162/98   Pulse 75   Temp (!) 97 F (36.1 C) (Oral)   Ht 5' 9" (1.753 m)   Wt 225 lb 3.2 oz (102.2 kg)   BMI 33.26 kg/m   Wt Readings from Last 3 Encounters:  08/11/18 225 lb 3.2 oz (102.2 kg)  05/11/18 225 lb (102.1 kg)  04/10/18 226 lb (102.5 kg)    Physical Exam Vitals signs and nursing note reviewed.  Constitutional:      General: He is not in acute distress.    Appearance: He is well-developed. He is not diaphoretic.  Eyes:     General: No scleral icterus.    Conjunctiva/sclera: Conjunctivae normal.  Neck:     Musculoskeletal: Neck supple.     Thyroid: No thyromegaly.  Cardiovascular:     Rate and Rhythm: Normal rate  and regular rhythm.     Heart sounds: Normal heart sounds. No murmur.  Pulmonary:     Effort: Pulmonary effort is normal. No respiratory distress.     Breath sounds: Normal breath sounds. No wheezing.  Musculoskeletal: Normal range of motion.  Lymphadenopathy:     Cervical: No cervical adenopathy.  Skin:    General: Skin is warm and dry.     Findings: No rash.  Neurological:     Mental Status: He is alert and oriented to person, place, and time.     Coordination: Coordination normal.  Psychiatric:        Behavior: Behavior normal.     Knee injection: Consent form signed. Risk factors of bleeding and infection discussed with patient and patient is agreeable towards injection. Patient prepped with Betadine. Lateral approach towards injection used. Injected 80 mg of Depo-Medrol and 1 mL of 2% lidocaine. Patient tolerated procedure well and no side effects from noted. Minimal to no bleeding. Simple bandage applied after.   Assessment & Plan:   Problem List Items Addressed This Visit      Cardiovascular and Mediastinum   Essential hypertension   Relevant Medications   nadolol (CORGARD) 40 MG tablet   amLODipine (NORVASC) 5 MG tablet   Other Relevant Orders   CBC with Differential/Platelet   CMP14+EGFR     Digestive   GERD   Relevant Orders   CBC with Differential/Platelet   CMP14+EGFR     Other   HLD (hyperlipidemia) - Primary   Relevant Medications   nadolol (CORGARD) 40 MG tablet   amLODipine (NORVASC) 5 MG tablet   Other Relevant Orders   CBC with Differential/Platelet   Lipid panel    Other Visit Diagnoses    Arthritis of right knee       Relevant Medications   methylPREDNISolone acetate (DEPO-MEDROL) injection 80 mg      Blood pressures are running good on home machine, he brought him tested against ours and it has the same today, he is just high in the office but looks good at home.  He says he typically runs between 120 and 130s over 70s to low 80s at home.   The 10-year ASCVD risk score Michael Hays DC Brooke Bonito., et al., 2013) is: 8.1%   Values used to calculate the score:     Age: 31 years     Sex: Male     Is Non-Hispanic African American: No     Diabetic: No     Tobacco smoker: No     Systolic Blood Pressure: 315 mmHg     Is BP treated: Yes  HDL Cholesterol: 52 mg/dL     Total Cholesterol: 302 mg/dL   Follow up plan: Return in about 6 months (around 02/11/2019), or if symptoms worsen or fail to improve, for Hypertension recheck.  Counseling provided for all of the vaccine components Orders Placed This Encounter  Procedures  . CBC with Differential/Platelet  . CMP14+EGFR  . Lipid panel    Caryl Pina, MD [facility] 08/11/2018, 9:46 AM

## 2018-08-17 MED ORDER — EVOLOCUMAB 140 MG/ML ~~LOC~~ SOSY: 140.0000 mg | PREFILLED_SYRINGE | SUBCUTANEOUS | 3 refills | Status: DC

## 2018-08-17 NOTE — Addendum Note (Signed)
Addended by: Caryl Pina on: 08/17/2018 01:46 PM   Modules accepted: Orders

## 2018-09-01 MED FILL — REPATHA 140 MG/ML SOSY: 140 | 28 days supply | Qty: 2 | Fill #0

## 2018-10-02 ENCOUNTER — Other Ambulatory Visit: Payer: Self-pay | Admitting: Family Medicine

## 2018-10-02 DIAGNOSIS — E782 Mixed hyperlipidemia: Secondary | ICD-10-CM

## 2018-10-02 MED FILL — REPATHA 140 MG/ML SOSY: 140 | 28 days supply | Qty: 2 | Fill #1

## 2018-10-02 MED FILL — EZETIMIBE 10 MG TABS: 10 | 90 days supply | Qty: 90 | Fill #1

## 2018-10-20 MED FILL — NADOLOL 40 MG TABLET: 40 | 90 days supply | Qty: 90 | Fill #0

## 2018-10-30 MED FILL — REPATHA 140 MG/ML SOSY: 140 | 28 days supply | Qty: 2 | Fill #2

## 2018-11-14 MED FILL — MELOXICAM 15 MG TABLET: 15 | 90 days supply | Qty: 90 | Fill #1

## 2018-11-24 MED FILL — REPATHA 140 MG/ML SOSY: 140 | 28 days supply | Qty: 2 | Fill #3

## 2018-12-25 MED FILL — REPATHA 140 MG/ML SOSY: 140 | 28 days supply | Qty: 2 | Fill #4

## 2019-01-03 MED FILL — EZETIMIBE 10 MG TABS: 10 | 90 days supply | Qty: 90 | Fill #0

## 2019-01-19 ENCOUNTER — Ambulatory Visit (INDEPENDENT_AMBULATORY_CARE_PROVIDER_SITE_OTHER): Payer: 59 | Admitting: Family

## 2019-01-19 ENCOUNTER — Encounter: Payer: Self-pay | Admitting: Family

## 2019-01-19 DIAGNOSIS — M109 Gout, unspecified: Secondary | ICD-10-CM | POA: Diagnosis not present

## 2019-01-19 MED ORDER — PREDNISONE 20 MG PO TABS: ORAL_TABLET | ORAL | 0 refills | Status: DC

## 2019-01-19 NOTE — Progress Notes (Signed)
   Virtual Visit via telephone Note Due to COVID-19 pandemic this visit was conducted virtually. This visit type was conducted due to national recommendations for restrictions regarding the COVID-19 Pandemic (e.g. social distancing, sheltering in place) in an effort to limit this patient's exposure and mitigate transmission in our community. All issues noted in this document were discussed and addressed.  A physical exam was not performed with this format.  I connected with Michael Hays on 01/19/19 at 9:54 AM  by telephone and verified that I am speaking with the correct person using two identifiers. [place] one is currently with him during visit. The provider, Evelina Dun, FNP is located in their office at time of visit.  I discussed the limitations, risks, security and privacy concerns of performing an evaluation and management service by telephone and the availability of in person appointments. I also discussed with the patient that there may be a patient responsible charge related to this service. The patient expressed understanding and agreed to proceed.   History and Present Illness:  HPI  Pt calls the office today with a gout flare up in his right foot. He states he has this flare up a couple times a year. He states he is currently at the beach and has drank a few more beer than usual and ate a lot of oysters. He reports the area is very tender and sharp that is a 9 out 10 pain. He states he just started in the middle of the night and hurt to even had the blanket touch his foot.   Review of Systems  Musculoskeletal: Positive for joint pain.  All other systems reviewed and are negative.    Observations/Objective: No SOB or distress noted   Assessment and Plan: 1. Acute gout of right foot, unspecified cause Low purine diet Motrin as needed Keep elevated Force fluids Pt states he can not take allopurinol, may need to discuss with  PCP trying this again if he continues to have gout flare ups?  Call if pain continues or does not improve  - predniSONE (DELTASONE) 20 MG tablet; Take 40 mg for 4 days, then 20 mg for 4 days,  And then 10 mg for 4 days.  Dispense: 14 tablet; Refill: 0     I discussed the assessment and treatment plan with the patient. The patient was provided an opportunity to ask questions and all were answered. The patient agreed with the plan and demonstrated an understanding of the instructions.   The patient was advised to call back or seek an in-person evaluation if the symptoms worsen or if the condition fails to improve as anticipated.  The above assessment and management plan was discussed with the patient. The patient verbalized understanding of and has agreed to the management plan. Patient is aware to call the clinic if symptoms persist or worsen. Patient is aware when to return to the clinic for a follow-up visit. Patient educated on when it is appropriate to go to the emergency department.   Time call ended: 10:07 AM   I provided 13 minutes of non-face-to-face time during this encounter.    Evelina Dun, FNP

## 2019-01-26 MED FILL — REPATHA 140 MG/ML SOSY: 140 | 28 days supply | Qty: 2 | Fill #5

## 2019-02-01 MED FILL — NADOLOL 40 MG TABLET: 40 | 90 days supply | Qty: 90 | Fill #1

## 2019-02-13 ENCOUNTER — Ambulatory Visit (INDEPENDENT_AMBULATORY_CARE_PROVIDER_SITE_OTHER): Payer: 59 | Admitting: Family Medicine

## 2019-02-13 ENCOUNTER — Other Ambulatory Visit: Payer: Self-pay

## 2019-02-13 ENCOUNTER — Encounter: Payer: Self-pay | Admitting: Family Medicine

## 2019-02-13 VITALS — BP 168/102 | HR 96 | Temp 99.6°F | Ht 69.0 in | Wt 221.4 lb

## 2019-02-13 DIAGNOSIS — I10 Essential (primary) hypertension: Secondary | ICD-10-CM | POA: Diagnosis not present

## 2019-02-13 DIAGNOSIS — E782 Mixed hyperlipidemia: Secondary | ICD-10-CM | POA: Diagnosis not present

## 2019-02-13 DIAGNOSIS — K219 Gastro-esophageal reflux disease without esophagitis: Secondary | ICD-10-CM | POA: Diagnosis not present

## 2019-02-13 DIAGNOSIS — Z9189 Other specified personal risk factors, not elsewhere classified: Secondary | ICD-10-CM | POA: Diagnosis not present

## 2019-02-13 DIAGNOSIS — M1711 Unilateral primary osteoarthritis, right knee: Secondary | ICD-10-CM | POA: Diagnosis not present

## 2019-02-13 MED ORDER — MELOXICAM 15 MG PO TABS: 15.0000 mg | ORAL_TABLET | Freq: Every day | ORAL | 3 refills | Status: DC

## 2019-02-13 MED ORDER — EZETIMIBE 10 MG PO TABS: 10.0000 mg | ORAL_TABLET | Freq: Every day | ORAL | 3 refills | Status: DC

## 2019-02-13 MED ORDER — LOSARTAN POTASSIUM 50 MG PO TABS: 50.0000 mg | ORAL_TABLET | Freq: Every day | ORAL | 3 refills | Status: DC

## 2019-02-13 MED ORDER — METHYLPREDNISOLONE ACETATE 80 MG/ML IJ SUSP
80.0000 mg | Freq: Once | INTRAMUSCULAR | Status: AC
  Administered 2019-02-13: 80 mg via INTRAMUSCULAR

## 2019-02-13 MED ORDER — REPATHA 140 MG/ML ~~LOC~~ SOSY: 140.0000 mg | PREFILLED_SYRINGE | SUBCUTANEOUS | 3 refills | Status: DC

## 2019-02-13 MED FILL — MELOXICAM 15 MG TABLET: 15 | 90 days supply | Qty: 90 | Fill #0

## 2019-02-13 MED FILL — LOSARTAN POTASSIUM 50 MG TA: 50 | 90 days supply | Qty: 90 | Fill #0

## 2019-02-13 NOTE — Progress Notes (Signed)
BP (!) 168/102   Pulse 96   Temp 99.6 F (37.6 C) (Temporal)   Ht _0  (1.753 m)   Wt 221 lb 6.4 oz (100.4 kg)   SpO2 96%   BMI 32.70 kg/m    Subjective:   Patient ID: Michael Hays, male    DOB: 1971-03-22, 48 y.o.   MRN: [id number]  HPI: Michael Hays is a 48 y.o. male presenting on 02/13/2019 for Hypertension (6 month follow up) and Knee Pain (Right- ongoing)    HPI Hypertension Patient is currently on amlodipine and lisinopril and nadolol, his blood pressures at home are 138 and 128 over 90s, he does say he gets a cough, and their blood pressure today is 168/102. Patient denies any lightheadedness or dizziness. Patient denies headaches, blurred vision, chest pains, shortness of breath, or weakness. Denies any side effects from medication and is content with current medication.   Hyperlipidemia Patient is coming in for recheck of his hyperlipidemia. The patient is currently taking Zetia and Repatha. They deny any issues with myalgias or history of liver damage from it. They deny any focal numbness or weakness or chest pain.   GERD Patient is currently on no medication currently and has been doing well.  She denies any major symptoms or abdominal pain or belching or burping. She denies any blood in her stool or lightheadedness or dizziness.   Patient has osteoarthritis and chronic knee pain in the right knee, he had an injection that gave him 6 months of relief and then just started coming back over the past few weeks.  He would like to get another injection.  He is using meloxicam as well which does help some but the injection helped so much more.  Relevant past medical, surgical, family and social history reviewed and updated as indicated. Interim medical history since our last visit reviewed. Allergies and medications reviewed and updated.  Review of Systems  Constitutional: Negative for chills and fever.  Eyes: Negative for visual disturbance.  Respiratory: Negative for  shortness of breath and wheezing.   Cardiovascular: Negative for chest pain and leg swelling.  Musculoskeletal: Negative for back pain and gait problem.  Skin: Negative for rash.  Neurological: Negative for dizziness, weakness and numbness.  All other systems reviewed and are negative.   Per HPI unless specifically indicated above   Allergies as of 02/13/2019      Reactions   Atenolol    Codeine    Hydrocodone-acetaminophen       Medication List       Accurate as of February 13, 2019  8:05 AM. If you have any questions, ask your nurse or doctor.        amLODipine 5 MG tablet Commonly known as: NORVASC Take 1 tablet (5 mg total) by mouth 2 (two) times daily. What changed: when to take this   Evolocumab 140 MG/ML Sosy Commonly known as: Repatha Inject 140 mg into the skin every 14 (fourteen) days.   ezetimibe 10 MG tablet Commonly known as: ZETIA TAKE 1 TABLET BY MOUTH ONCE DAILY   lisinopril 10 MG tablet Commonly known as: ZESTRIL Take 1 tablet (10 mg total) by mouth at bedtime.   meloxicam 15 MG tablet Commonly known as: MOBIC Take 1 tablet (15 mg total) by mouth daily.   nadolol 40 MG tablet Commonly known as: CORGARD TAKE ONE TABLET BY MOUTH EVERY DAY   predniSONE 20 MG tablet Commonly known as: DELTASONE Take 40 mg for 4  days, then 20 mg for 4 days,  And then 10 mg for 4 days.        Objective:   BP (!) 183/105   Pulse 96   Temp 99.6 F (37.6 C) (Temporal)   Ht '5\' 9"'$  (1.753 m)   Wt 221 lb 6.4 oz (100.4 kg)   SpO2 96%   BMI 32.70 kg/m   Wt Readings from Last 3 Encounters:  02/13/19 221 lb 6.4 oz (100.4 kg)  08/11/18 225 lb 3.2 oz (102.2 kg)  05/11/18 225 lb (102.1 kg)    Physical Exam Vitals signs and nursing note reviewed.  Constitutional:      General: He is not in acute distress.    Appearance: He is well-developed. He is not diaphoretic.  Eyes:     General: No scleral icterus.    Conjunctiva/sclera: Conjunctivae normal.  Neck:      Musculoskeletal: Neck supple.     Thyroid: No thyromegaly.  Cardiovascular:     Rate and Rhythm: Normal rate and regular rhythm.     Heart sounds: Normal heart sounds. No murmur.  Pulmonary:     Effort: Pulmonary effort is normal. No respiratory distress.     Breath sounds: Normal breath sounds. No wheezing.  Musculoskeletal: Normal range of motion.  Lymphadenopathy:     Cervical: No cervical adenopathy.  Skin:    General: Skin is warm and dry.     Findings: No rash.  Neurological:     Mental Status: He is alert and oriented to person, place, and time.     Coordination: Coordination normal.  Psychiatric:        Behavior: Behavior normal.     Knee injection: Consent form signed. Risk factors of bleeding and infection discussed with patient and patient is agreeable towards injection. Patient prepped with Betadine. Lateral approach towards injection used. Injected 80 mg of Depo-Medrol and 1 mL of 2% lidocaine. Patient tolerated procedure well and no side effects from noted. Minimal to no bleeding. Simple bandage applied after.   Assessment & Plan:   Problem List Items Addressed This Visit      Cardiovascular and Mediastinum   Essential hypertension   Relevant Medications   Evolocumab (REPATHA) 140 MG/ML SOSY   ezetimibe (ZETIA) 10 MG tablet   losartan (COZAAR) 50 MG tablet   Other Relevant Orders   CMP14+EGFR     Digestive   GERD - Primary   Relevant Orders   CBC with Differential/Platelet     Other   HLD (hyperlipidemia)   Relevant Medications   Evolocumab (REPATHA) 140 MG/ML SOSY   ezetimibe (ZETIA) 10 MG tablet   losartan (COZAAR) 50 MG tablet   Other Relevant Orders   Lipid panel    Other Visit Diagnoses    10 year risk of MI or stroke 7.5% or greater       Relevant Medications   Evolocumab (REPATHA) 140 MG/ML SOSY   Arthritis of right knee       Relevant Medications   meloxicam (MOBIC) 15 MG tablet   methylPREDNISolone acetate (DEPO-MEDROL) injection 80  mg (Completed) (Start on 02/13/2019  8:30 AM)      Patient blood pressure is elevated but he says at home it runs in the 130s and 120s and he checked it multiple times over the past few days.  Patient has had a cough so we will switch to losartan from lisinopril Follow up plan: Return in about 6 months (around 08/13/2019), or if symptoms worsen or  fail to improve, for Hypertension and cholesterol.  Counseling provided for all of the vaccine components Orders Placed This Encounter  Procedures  . CBC with Differential/Platelet  . CMP14+EGFR  . Lipid panel    Caryl Pina, MD [facility] 02/13/2019, 8:05 AM

## 2019-02-14 ENCOUNTER — Other Ambulatory Visit: Payer: Self-pay

## 2019-02-14 ENCOUNTER — Other Ambulatory Visit: Payer: 59

## 2019-02-14 DIAGNOSIS — E875 Hyperkalemia: Secondary | ICD-10-CM | POA: Diagnosis not present

## 2019-02-14 LAB — CBC WITH DIFFERENTIAL/PLATELET
Basophils Absolute: 0.1 10*3/uL (ref 0.0–0.2)
Basos: 1 %
EOS (ABSOLUTE): 0.2 10*3/uL (ref 0.0–0.4)
Eos: 3 %
Hematocrit: 47.8 % (ref 37.5–51.0)
Hemoglobin: 16.2 g/dL (ref 13.0–17.7)
Immature Grans (Abs): 0 10*3/uL (ref 0.0–0.1)
Immature Granulocytes: 1 %
Lymphocytes Absolute: 1.1 10*3/uL (ref 0.7–3.1)
Lymphs: 18 %
MCH: 29.5 pg (ref 26.6–33.0)
MCHC: 33.9 g/dL (ref 31.5–35.7)
MCV: 87 fL (ref 79–97)
Monocytes Absolute: 0.8 10*3/uL (ref 0.1–0.9)
Monocytes: 13 %
Neutrophils Absolute: 4 10*3/uL (ref 1.4–7.0)
Neutrophils: 64 %
Platelets: 258 10*3/uL (ref 150–450)
RBC: 5.49 x10E6/uL (ref 4.14–5.80)
RDW: 12.7 % (ref 11.6–15.4)
WBC: 6.2 10*3/uL (ref 3.4–10.8)

## 2019-02-14 LAB — CMP14+EGFR
ALT: 46 IU/L — ABNORMAL HIGH (ref 0–44)
AST: 37 IU/L (ref 0–40)
Albumin/Globulin Ratio: 1.9 (ref 1.2–2.2)
Albumin: 4.8 g/dL (ref 4.0–5.0)
Alkaline Phosphatase: 78 IU/L (ref 39–117)
BUN/Creatinine Ratio: 22 — ABNORMAL HIGH (ref 9–20)
BUN: 19 mg/dL (ref 6–24)
Bilirubin Total: 0.6 mg/dL (ref 0.0–1.2)
CO2: 23 mmol/L (ref 20–29)
Calcium: 10.2 mg/dL (ref 8.7–10.2)
Chloride: 102 mmol/L (ref 96–106)
Creatinine, Ser: 0.87 mg/dL (ref 0.76–1.27)
GFR calc Af Amer: 118 mL/min/{1.73_m2} (ref >59)
GFR calc non Af Amer: 102 mL/min/{1.73_m2} (ref >59)
Globulin, Total: 2.5 g/dL (ref 1.5–4.5)
Glucose: 105 mg/dL — ABNORMAL HIGH (ref 65–99)
Potassium: 5.9 mmol/L (ref 3.5–5.2)
Sodium: 141 mmol/L (ref 134–144)
Total Protein: 7.3 g/dL (ref 6.0–8.5)

## 2019-02-14 LAB — BMP8+EGFR
BUN/Creatinine Ratio: 24 — ABNORMAL HIGH (ref 9–20)
BUN: 31 mg/dL — ABNORMAL HIGH (ref 6–24)
CO2: 19 mmol/L — ABNORMAL LOW (ref 20–29)
Calcium: 9.8 mg/dL (ref 8.7–10.2)
Chloride: 101 mmol/L (ref 96–106)
Creatinine, Ser: 1.28 mg/dL — ABNORMAL HIGH (ref 0.76–1.27)
GFR calc Af Amer: 76 mL/min/{1.73_m2} (ref >59)
GFR calc non Af Amer: 66 mL/min/{1.73_m2} (ref >59)
Glucose: 112 mg/dL — ABNORMAL HIGH (ref 65–99)
Potassium: 4.8 mmol/L (ref 3.5–5.2)
Sodium: 137 mmol/L (ref 134–144)

## 2019-02-14 LAB — LIPID PANEL
Chol/HDL Ratio: 2.2 ratio (ref 0.0–5.0)
Cholesterol, Total: 155 mg/dL (ref 100–199)
HDL: 70 mg/dL (ref >39)
LDL Chol Calc (NIH): 64 mg/dL (ref 0–99)
Triglycerides: 121 mg/dL (ref 0–149)
VLDL Cholesterol Cal: 21 mg/dL (ref 5–40)

## 2019-02-21 ENCOUNTER — Other Ambulatory Visit: Payer: Self-pay | Admitting: Family Medicine

## 2019-03-05 MED FILL — REPATHA 140 MG/ML SOSY: 140 | 28 days supply | Qty: 2 | Fill #6

## 2019-03-12 ENCOUNTER — Ambulatory Visit (INDEPENDENT_AMBULATORY_CARE_PROVIDER_SITE_OTHER): Payer: 59 | Admitting: Family Medicine

## 2019-03-12 ENCOUNTER — Encounter: Payer: Self-pay | Admitting: Family Medicine

## 2019-03-12 VITALS — BP 156/99 | HR 89 | Temp 99.6°F | Ht 69.0 in | Wt 217.2 lb

## 2019-03-12 DIAGNOSIS — M10061 Idiopathic gout, right knee: Secondary | ICD-10-CM | POA: Diagnosis not present

## 2019-03-12 MED ORDER — PREDNISONE 20 MG PO TABS: ORAL_TABLET | ORAL | 0 refills | Status: DC

## 2019-03-12 NOTE — Progress Notes (Signed)
BP (!) 156/99   Pulse 89   Temp 99.6 F (37.6 C) (Temporal)   Ht 5\' 9"  (1.753 m)   Wt 217 lb 3.2 oz (98.5 kg)   SpO2 94%   BMI 32.07 kg/m    Subjective:   Patient ID: Michael Hays, male    DOB: 02/14/1971, 48 y.o.   MRN: [id number]  HPI: Michael Hays is a 48 y.o. male presenting on 03/12/2019 for Joint Swelling (right x 2 days)   HPI Patient comes in complaining of swelling pain in his right knee that started a couple days ago.  He said it was really swollen and very painful especially on the top lateral aspect of the knee.  He denies any fevers or chills or redness.  He took colchicine and it got better within a day and now the swelling is drastically reducing but the colchicine does give him diarrhea.  Patient denies any locking catching or giving way of the knee.  Relevant past medical, surgical, family and social history reviewed and updated as indicated. Interim medical history since our last visit reviewed. Allergies and medications reviewed and updated.  Review of Systems  Constitutional: Negative for chills and fever.  Respiratory: Negative for shortness of breath and wheezing.   Cardiovascular: Negative for chest pain and leg swelling.  Musculoskeletal: Positive for arthralgias and joint swelling. Negative for back pain and gait problem.  Skin: Negative for rash.  All other systems reviewed and are negative.   Per HPI unless specifically indicated above   Allergies as of 03/12/2019      Reactions   Atenolol    Codeine    Hydrocodone-acetaminophen       Medication List       Accurate as of March 12, 2019  3:39 PM. If you have any questions, ask your nurse or doctor.        STOP taking these medications   losartan 50 MG tablet Commonly known as: Cozaar Stopped by: Fransisca Kaufmann Bernice Mullin, MD     TAKE these medications   amLODipine 5 MG tablet Commonly known as: NORVASC Take 1 tablet (5 mg total) by mouth 2 (two) times daily. What changed: when to  take this   ezetimibe 10 MG tablet Commonly known as: ZETIA Take 1 tablet (10 mg total) by mouth daily.   lisinopril 10 MG tablet Commonly known as: ZESTRIL Take 10 mg by mouth daily.   meloxicam 15 MG tablet Commonly known as: MOBIC Take 1 tablet (15 mg total) by mouth daily.   nadolol 40 MG tablet Commonly known as: CORGARD TAKE ONE TABLET BY MOUTH EVERY DAY   predniSONE 20 MG tablet Commonly known as: DELTASONE Take 3 tabs daily for 1 week, then 2 tabs daily for week 2, then 1 tab daily for week 3. Started by: Worthy Rancher, MD   Repatha 140 MG/ML Sosy Generic drug: Evolocumab Inject 140 mg into the skin every 14 (fourteen) days.        Objective:   BP (!) 156/99   Pulse 89   Temp 99.6 F (37.6 C) (Temporal)   Ht 5\' 9"  (1.753 m)   Wt 217 lb 3.2 oz (98.5 kg)   SpO2 94%   BMI 32.07 kg/m   Wt Readings from Last 3 Encounters:  03/12/19 217 lb 3.2 oz (98.5 kg)  02/13/19 221 lb 6.4 oz (100.4 kg)  08/11/18 225 lb 3.2 oz (102.2 kg)    Physical Exam Vitals signs and nursing  note reviewed.  Constitutional:      General: He is not in acute distress.    Appearance: He is well-developed. He is not diaphoretic.  Eyes:     General: No scleral icterus.    Conjunctiva/sclera: Conjunctivae normal.  Neck:     Thyroid: No thyromegaly.  Musculoskeletal: Normal range of motion.        General: Swelling present.     Right knee: He exhibits swelling and effusion. He exhibits no deformity, no erythema, normal alignment, no LCL laxity, normal patellar mobility, normal meniscus and no MCL laxity. Tenderness found. Lateral joint line tenderness noted.  Skin:    General: Skin is warm and dry.     Findings: No rash.  Neurological:     Mental Status: He is alert and oriented to person, place, and time.     Coordination: Coordination normal.  Psychiatric:        Behavior: Behavior normal.       Assessment & Plan:   Problem List Items Addressed This Visit       Other   Gout - Primary   Relevant Medications   predniSONE (DELTASONE) 20 MG tablet      Patient sounds like he had a gout flare and is improving, to colchicine but still not fully resolved, still has some fluid there although not excessive amounts, we discussed possibly draining from some steroids in it but he just had a knee injection with a month ago and would prefer not to go in again at this point.  We will try the prednisone. Follow up plan: Return if symptoms worsen or fail to improve.  Counseling provided for all of the vaccine components No orders of the defined types were placed in this encounter.   Caryl Pina, MD [facility] 03/12/2019, 3:39 PM

## 2019-03-28 MED FILL — AMLODIPINE BESYLATE 5 MG TA: 5 | 90 days supply | Qty: 180 | Fill #1

## 2019-03-28 MED FILL — REPATHA 140 MG/ML SOSY: 140 | 28 days supply | Qty: 2 | Fill #7

## 2019-04-13 ENCOUNTER — Other Ambulatory Visit: Payer: Self-pay | Admitting: Family Medicine

## 2019-04-18 ENCOUNTER — Ambulatory Visit (INDEPENDENT_AMBULATORY_CARE_PROVIDER_SITE_OTHER): Payer: 59

## 2019-04-18 ENCOUNTER — Other Ambulatory Visit: Payer: Self-pay | Admitting: Family Medicine

## 2019-04-18 ENCOUNTER — Other Ambulatory Visit: Payer: Self-pay

## 2019-04-18 ENCOUNTER — Ambulatory Visit (INDEPENDENT_AMBULATORY_CARE_PROVIDER_SITE_OTHER): Payer: 59 | Admitting: Family Medicine

## 2019-04-18 ENCOUNTER — Encounter: Payer: Self-pay | Admitting: Family Medicine

## 2019-04-18 VITALS — BP 154/97 | HR 86 | Temp 97.3°F | Resp 20 | Ht 69.0 in | Wt 220.0 lb

## 2019-04-18 DIAGNOSIS — M25561 Pain in right knee: Secondary | ICD-10-CM

## 2019-04-18 DIAGNOSIS — R898 Other abnormal findings in specimens from other organs, systems and tissues: Secondary | ICD-10-CM

## 2019-04-18 DIAGNOSIS — M25461 Effusion, right knee: Secondary | ICD-10-CM | POA: Diagnosis not present

## 2019-04-18 DIAGNOSIS — M1711 Unilateral primary osteoarthritis, right knee: Secondary | ICD-10-CM | POA: Diagnosis not present

## 2019-04-18 MED ORDER — TRAMADOL HCL 50 MG PO TABS: 50.0000 mg | ORAL_TABLET | Freq: Three times a day (TID) | ORAL | 0 refills | Status: AC | PRN

## 2019-04-18 MED ORDER — CIPROFLOXACIN HCL 500 MG PO TABS: 500.0000 mg | ORAL_TABLET | Freq: Two times a day (BID) | ORAL | 0 refills | Status: AC

## 2019-04-18 MED ORDER — NAPROXEN 500 MG PO TABS: 500.0000 mg | ORAL_TABLET | Freq: Two times a day (BID) | ORAL | 0 refills | Status: DC

## 2019-04-18 NOTE — Progress Notes (Signed)
Subjective:  Patient ID: Michael Hays, male    DOB: 1970/11/07, 49 y.o.   MRN: [id number]  Patient Care Team: Dettinger, Fransisca Kaufmann, MD as PCP - General (Family Medicine)   Chief Complaint:  Knee Pain (right - pain and swelling )   HPI: Michael Hays is a 49 y.o. male presenting on 04/18/2019 for Knee Pain (right - pain and swelling )   Daughter fell on knee on Friday and bent it back really far. Pt reports continued pain and swelling to knee.  Knee Pain  The incident occurred 5 to 7 days ago. The incident occurred at home. The injury mechanism was a direct blow. The pain is present in the right knee. The quality of the pain is described as aching, shooting and stabbing. The pain is at a severity of 7/10. The pain is moderate. The pain has been constant since onset. Associated symptoms include an inability to bear weight (without pain). Pertinent negatives include no loss of motion, loss of sensation, muscle weakness, numbness or tingling. He reports no foreign bodies present. The symptoms are aggravated by movement and weight bearing. He has tried acetaminophen and NSAIDs for the symptoms. The treatment provided mild relief.     Relevant past medical, surgical, family, and social history reviewed and updated as indicated.  Allergies and medications reviewed and updated. Date reviewed: Chart in Epic.   Past Medical History:  Diagnosis Date  . Allergy   . GERD (gastroesophageal reflux disease)   . Headache(784.0)   . Hyperlipidemia   . Hypertension     History reviewed. No pertinent surgical history.  Social History   Socioeconomic History  . Marital status: Married    Spouse name: Not on file  . Number of children: Not on file  . Years of education: Not on file  . Highest education level: Not on file  Occupational History  . Occupation: Games developer  Tobacco Use  . Smoking status: Never Smoker  . Smokeless tobacco: Current User    Types: Chew  Substance and Sexual  Activity  . Alcohol use: Yes    Alcohol/week: 7.0 standard drinks    Types: 7 Standard drinks or equivalent per week  . Drug use: Not on file  . Sexual activity: Yes  Other Topics Concern  . Not on file  Social History Narrative  . Not on file   Social Determinants of Health   Financial Resource Strain:   . Difficulty of Paying Living Expenses: Not on file  Food Insecurity:   . Worried About Charity fundraiser in the Last Year: Not on file  . Ran Out of Food in the Last Year: Not on file  Transportation Needs:   . Lack of Transportation (Medical): Not on file  . Lack of Transportation (Non-Medical): Not on file  Physical Activity:   . Days of Exercise per Week: Not on file  . Minutes of Exercise per Session: Not on file  Stress:   . Feeling of Stress : Not on file  Social Connections:   . Frequency of Communication with Friends and Family: Not on file  . Frequency of Social Gatherings with Friends and Family: Not on file  . Attends Religious Services: Not on file  . Active Member of Clubs or Organizations: Not on file  . Attends Archivist Meetings: Not on file  . Marital Status: Not on file  Intimate Partner Violence:   . Fear of Current or Ex-Partner: Not  on file  . Emotionally Abused: Not on file  . Physically Abused: Not on file  . Sexually Abused: Not on file    Outpatient Encounter Medications as of 04/18/2019  Medication Sig  . amLODipine (NORVASC) 5 MG tablet Take 1 tablet (5 mg total) by mouth 2 (two) times daily. (Patient taking differently: Take 5 mg by mouth daily. )  . Evolocumab (REPATHA) 140 MG/ML SOSY Inject 140 mg into the skin every 14 (fourteen) days.  Marland Kitchen ezetimibe (ZETIA) 10 MG tablet Take 1 tablet (10 mg total) by mouth daily.  Marland Kitchen lisinopril (ZESTRIL) 10 MG tablet TAKE 1 TABLET BY MOUTH AT BEDTIME  . nadolol (CORGARD) 40 MG tablet TAKE ONE TABLET BY MOUTH EVERY DAY  . ciprofloxacin (CIPRO) 500 MG tablet Take 1 tablet (500 mg total) by mouth  2 (two) times daily for 7 days.  . naproxen (NAPROSYN) 500 MG tablet Take 1 tablet (500 mg total) by mouth 2 (two) times daily with a meal.  . predniSONE (DELTASONE) 20 MG tablet Take 3 tabs daily for 1 week, then 2 tabs daily for week 2, then 1 tab daily for week 3. (Patient not taking: Reported on 04/18/2019)  . traMADol (ULTRAM) 50 MG tablet Take 1 tablet (50 mg total) by mouth every 8 (eight) hours as needed for up to 5 days.  . [DISCONTINUED] meloxicam (MOBIC) 15 MG tablet Take 1 tablet (15 mg total) by mouth daily.   No facility-administered encounter medications on file as of 04/18/2019.    Allergies  Allergen Reactions  . Atenolol   . Codeine   . Hydrocodone-Acetaminophen     Review of Systems  Constitutional: Negative for activity change, appetite change, chills, diaphoresis, fatigue, fever and unexpected weight change.  Eyes: Negative for photophobia and visual disturbance.  Respiratory: Negative for cough and shortness of breath.   Cardiovascular: Negative for chest pain, palpitations and leg swelling.  Gastrointestinal: Negative for abdominal pain, nausea and vomiting.  Genitourinary: Negative for decreased urine volume and difficulty urinating.  Musculoskeletal: Positive for arthralgias, gait problem, joint swelling and myalgias. Negative for back pain, neck pain and neck stiffness.  Skin: Negative for color change and pallor.  Neurological: Negative for dizziness, tingling, tremors, seizures, syncope, facial asymmetry, speech difficulty, weakness, light-headedness, numbness and headaches.  Psychiatric/Behavioral: Negative for confusion.  All other systems reviewed and are negative.       Objective:  BP (!) 154/97   Pulse 86   Temp (!) 97.3 F (36.3 C)   Resp 20   Ht '5\' 9"'$  (1.753 m)   Wt 220 lb (99.8 kg)   SpO2 97%   BMI 32.49 kg/m    Wt Readings from Last 3 Encounters:  04/18/19 220 lb (99.8 kg)  03/12/19 217 lb 3.2 oz (98.5 kg)  02/13/19 221 lb 6.4 oz  (100.4 kg)    Physical Exam Vitals and nursing note reviewed.  Constitutional:      General: He is not in acute distress.    Appearance: Normal appearance. He is well-developed and well-groomed. He is not ill-appearing, toxic-appearing or diaphoretic.  HENT:     Head: Normocephalic and atraumatic.     Jaw: There is normal jaw occlusion.     Right Ear: Hearing normal.     Left Ear: Hearing normal.     Nose: Nose normal.     Mouth/Throat:     Lips: Pink.     Mouth: Mucous membranes are moist.     Pharynx: Oropharynx is clear.  Uvula midline.  Eyes:     General: Lids are normal.     Extraocular Movements: Extraocular movements intact.     Conjunctiva/sclera: Conjunctivae normal.     Pupils: Pupils are equal, round, and reactive to light.  Neck:     Thyroid: No thyroid mass, thyromegaly or thyroid tenderness.     Vascular: No carotid bruit or JVD.     Trachea: Trachea and phonation normal.  Cardiovascular:     Rate and Rhythm: Normal rate and regular rhythm.     Chest Wall: PMI is not displaced.     Pulses: Normal pulses.     Heart sounds: Normal heart sounds. No murmur. No friction rub. No gallop.   Pulmonary:     Effort: Pulmonary effort is normal. No respiratory distress.     Breath sounds: Normal breath sounds. No wheezing.  Abdominal:     General: Bowel sounds are normal. There is no distension or abdominal bruit.     Palpations: Abdomen is soft. There is no hepatomegaly or splenomegaly.     Tenderness: There is no abdominal tenderness. There is no right CVA tenderness or left CVA tenderness.     Hernia: No hernia is present.  Musculoskeletal:        General: Swelling present.     Cervical back: Normal range of motion and neck supple.     Right upper leg: Normal.     Right knee: Swelling present. No deformity, erythema, lacerations or bony tenderness. Decreased range of motion. Tenderness present. No LCL laxity, MCL laxity, ACL laxity or PCL laxity. Normal alignment.  Normal pulse.     Right lower leg: Normal. No edema.     Left lower leg: No edema.     Comments: Right knee: decreased flexion and extension due to pain and swelling. No erythema or increased warmth. Anterior medial, lateral, and proximal swelling.  Lymphadenopathy:     Cervical: No cervical adenopathy.  Skin:    General: Skin is warm and dry.     Capillary Refill: Capillary refill takes less than 2 seconds.     Coloration: Skin is not cyanotic, jaundiced or pale.     Findings: No rash.  Neurological:     General: No focal deficit present.     Mental Status: He is alert and oriented to person, place, and time.     Cranial Nerves: Cranial nerves are intact. No cranial nerve deficit.     Sensory: Sensation is intact. No sensory deficit.     Motor: Motor function is intact. No weakness.     Coordination: Coordination is intact. Coordination normal.     Gait: Gait abnormal (antalgic).     Deep Tendon Reflexes: Reflexes are normal and symmetric. Reflexes normal.  Psychiatric:        Attention and Perception: Attention and perception normal.        Mood and Affect: Mood and affect normal.        Speech: Speech normal.        Behavior: Behavior normal. Behavior is cooperative.        Thought Content: Thought content normal.        Cognition and Memory: Cognition and memory normal.        Judgment: Judgment normal.     Results for orders placed or performed in visit on 02/14/19  BMP8+EGFR  Result Value Ref Range   Glucose 112 (H) 65 - 99 mg/dL   BUN 31 (H) 6 - 24 mg/dL  Creatinine, Ser 1.28 (H) 0.76 - 1.27 mg/dL   GFR calc non Af Amer 66 >59 mL/min/1.73   GFR calc Af Amer 76 >59 mL/min/1.73   BUN/Creatinine Ratio 24 (H) 9 - 20   Sodium 137 134 - 144 mmol/L   Potassium 4.8 3.5 - 5.2 mmol/L   Chloride 101 96 - 106 mmol/L   CO2 19 (L) 20 - 29 mmol/L   Calcium 9.8 8.7 - 10.2 mg/dL     Joint Injection/Arthrocentesis  Date/Time: 04/18/2019 9:23 AM Performed by: Baruch Gouty,  FNP Authorized by: Baruch Gouty, FNP  Indications: joint swelling, pain and diagnostic evaluation  Body area: knee Joint: right knee Local anesthesia used: yes  Anesthesia: Local anesthesia used: yes Local Anesthetic: lidocaine spray  Sedation: Patient sedated: no  Preparation: Patient was prepped and draped in the usual sterile fashion (Verbal consent obtained. Pts name, DOB and allergeis verified prior to procedure. Correct procedure verified). Needle size: 22 G Ultrasound guidance: no Approach: lateral Aspirate: cloudy and yellow Aspirate amount: 40 mL Patient tolerance: patient tolerated the procedure well with no immediate complications    X-Ray: right knee: effusion and soft tissue swelling, no bony abnormalities. Preliminary x-ray reading by Monia Pouch, FNP-C, [facility].   Pertinent labs & imaging results that were available during my care of the patient were reviewed by me and considered in my medical decision making.  Assessment & Plan:  Hue was seen today for knee pain.  Diagnoses and all orders for this visit:  Acute pain of right knee Pain and swelling of right knee Cloudy synovial fluid Knee swelling and pain post injury six days ago from a direct blow. Xray with effusion and soft tissue swelling. No bony abnormalities. Will notify pt if radiology reading differs. Arthrocentesis in office today with cloudy return of synovial fluid. No systemic signs of infection. No erythema or increased warmth of joint. Due to appearance of synovial fluid, will initiate oral antibiotics. Pt aware if culture is positive, he will have to be treated in hospital with IV antibiotics and possible surgery for washout. Knee brace applied in office today. NSAIDs for pain as prescribed. Will prescribed Ultram for severe pain as needed. Aftercare discussed in detail. Medications as prescribed.  -     DG Knee 1-2 Views Right; Future -     Synovial Fluid Panel -     traMADol (ULTRAM) 50 MG  tablet; Take 1 tablet (50 mg total) by mouth every 8 (eight) hours as needed for up to 5 days. -     Joint Injection/Arthrocentesis -     naproxen (NAPROSYN) 500 MG tablet; Take 1 tablet (500 mg total) by mouth 2 (two) times daily with a meal. -     ciprofloxacin (CIPRO) 500 MG tablet; Take 1 tablet (500 mg total) by mouth 2 (two) times daily for 7 days.     Continue all other maintenance medications.  Follow up plan: Return in about 1 week (around 04/25/2019), or if symptoms worsen or fail to improve.  Continue healthy lifestyle choices, including diet (rich in fruits, vegetables, and lean proteins, and low in salt and simple carbohydrates) and exercise (at least 30 minutes of moderate physical activity daily).  Educational handout given for arthrocentesis of knee aftercare, knee pain  The above assessment and management plan was discussed with the patient. The patient verbalized understanding of and has agreed to the management plan. Patient is aware to call the clinic if they develop any new symptoms or  if symptoms persist or worsen. Patient is aware when to return to the clinic for a follow-up visit. Patient educated on when it is appropriate to go to the emergency department.   Monia Pouch, FNP-C [facility] [phone]

## 2019-04-18 NOTE — Patient Instructions (Signed)
Knee Arthrocentesis, Care After This sheet gives you information about how to care for yourself after your procedure. Your health care provider may also give you more specific instructions. If you have problems or questions, contact your health care provider. What can I expect after the procedure? After the procedure, it is common to have:  Bruising.  Swelling.  Soreness. Follow these instructions at home: Managing pain, stiffness, and swelling   If directed, put ice on your knee: ? Put ice in a plastic bag. ? Place a towel between your skin and the bag. ? Leave the ice on for 20 minutes, 2-3 times a day.  Do not put heat on your knee.  Wrap your knee to help prevent swelling as told by your health care provider. Ask your health care provider to show you how to wrap your knee.  Move your toes often to avoid stiffness and to lessen swelling.  Raise (elevate) your knee above the level of your heart while you are sitting or lying down. To do this, try putting a few pillows under your knee and lower leg. Bathing  Do not take baths, swim, or use a hot tub until your health care provider approves. Ask your health care provider if you may take showers. Activity  Ask your health care provider what activities are safe for you during recovery, and ask what activities you need to avoid. In some cases, you may be able to return to normal activities right away. Driving   Do not drive until your health care provider approves. You should not drive for 24 hours if you were given a medicine to help you relax (sedative) during your procedure.  Do not drive or use heavy machinery while taking prescription pain medicine. General instructions  Take over-the-counter and prescription medicines only as told by your health care provider.  Check your puncture site every day for signs of infection. Check for: ? Redness, swelling, or pain. ? Fluid or blood. ? Warmth. ? Pus or a bad smell.  Change  your bandage (dressing) as told by your health care provider. Make sure you wash your hands with soap and water before you change your dressing. If you cannot use soap and water, use hand sanitizer.  Keep all follow-up visits as told by your health care provider. This is important. Contact a health care provider if you have:  Swelling or stiffness that: ? Gets worse. ? Becomes severe. ? Does not get better with medicine.  A fever.  Any of the following around your puncture site: ? Redness, swelling, or pain. ? Fluid or blood draining. ? Unusual warmth. ? Pus or a bad smell. Get help right away if you have:  Shortness of breath.  Severe pain.  Pain that does not get better with medicine. Summary  It is common to have mild soreness for a couple of days after this procedure.  Talk with your health care provider about how to use ice and wraps to prevent swelling.  Get help right away if you feel short of breath or have pain that does not get better with medicine. This information is not intended to replace advice given to you by your health care provider. Make sure you discuss any questions you have with your health care provider. Document Revised: 01/12/2018 Document Reviewed: 03/08/2017 Elsevier Patient Education  [place]. Acute Knee Pain, Adult Many things can cause knee pain. Sometimes, knee pain is sudden (acute) and may be caused by damage, swelling, or  irritation of the muscles and tissues that support your knee. The pain often goes away on its own with time and rest. If the pain does not go away, tests may be done to find out what is causing the pain. Follow these instructions at home: Pay attention to any changes in your symptoms. Take these actions to relieve your pain. If you have a knee sleeve or brace:   Wear the sleeve or brace as told by your doctor. Remove it only as told by your doctor.  Loosen the sleeve or brace if your toes: ? Tingle. ? Become  numb. ? Turn cold and blue.  Keep the sleeve or brace clean.  If the sleeve or brace is not waterproof: ? Do not let it get wet. ? Cover it with a watertight covering when you take a bath or shower. Activity  Rest your knee.  Do not do things that cause pain.  Avoid activities where both feet leave the ground at the same time (high-impact activities). Examples are running, jumping rope, and doing jumping jacks.  Work with a physical therapist to make a safe exercise program, as told by your doctor. Managing pain, stiffness, and swelling   If told, put ice on the knee: ? Put ice in a plastic bag. ? Place a towel between your skin and the bag. ? Leave the ice on for 20 minutes, 2-3 times a day.  If told, put pressure (compression) on your injured knee to control swelling, give support, and help with discomfort. Compression may be done with an elastic bandage. General instructions  Take all medicines only as told by your doctor.  Raise (elevate) your knee while you are sitting or lying down. Make sure your knee is higher than your heart.  Sleep with a pillow under your knee.  Do not use any products that contain nicotine or tobacco. These include cigarettes, e-cigarettes, and chewing tobacco. These products may slow down healing. If you need help quitting, ask your doctor.  If you are overweight, work with your doctor and a food expert (dietitian) to set goals to lose weight. Being overweight can make your knee hurt more.  Keep all follow-up visits as told by your doctor. This is important. Contact a doctor if:  The knee pain does not stop.  The knee pain changes or gets worse.  You have a fever along with knee pain.  Your knee feels warm when you touch it.  Your knee gives out or locks up. Get help right away if:  Your knee swells, and the swelling gets worse.  You cannot move your knee.  You have very bad knee pain. Summary  Many things can cause knee pain.  The pain often goes away on its own with time and rest.  Your doctor may do tests to find out the cause of the pain.  Pay attention to any changes in your symptoms. Relieve your pain with rest, medicines, light activity, and use of ice.  Get help right away if you cannot move your knee or your knee pain is very bad. This information is not intended to replace advice given to you by your health care provider. Make sure you discuss any questions you have with your health care provider. Document Revised: 09/01/2017 Document Reviewed: 09/01/2017 Elsevier Patient Education  [place].

## 2019-04-23 ENCOUNTER — Other Ambulatory Visit: Payer: Self-pay

## 2019-04-23 ENCOUNTER — Encounter (HOSPITAL_BASED_OUTPATIENT_CLINIC_OR_DEPARTMENT_OTHER): Payer: Self-pay

## 2019-04-23 ENCOUNTER — Emergency Department (HOSPITAL_BASED_OUTPATIENT_CLINIC_OR_DEPARTMENT_OTHER)
Admission: EM | Admit: 2019-04-23 | Discharge: 2019-04-23 | Disposition: A | Discharge status: Home / Self Care | Payer: 59 | Attending: Emergency Medicine | Admitting: Emergency Medicine

## 2019-04-23 DIAGNOSIS — F17220 Nicotine dependence, chewing tobacco, uncomplicated: Secondary | ICD-10-CM | POA: Insufficient documentation

## 2019-04-23 DIAGNOSIS — I10 Essential (primary) hypertension: Secondary | ICD-10-CM | POA: Diagnosis not present

## 2019-04-23 DIAGNOSIS — M25561 Pain in right knee: Secondary | ICD-10-CM | POA: Diagnosis present

## 2019-04-23 DIAGNOSIS — M25461 Effusion, right knee: Secondary | ICD-10-CM | POA: Diagnosis not present

## 2019-04-23 MED ORDER — PREDNISONE 10 MG PO TABS: ORAL_TABLET | ORAL | 0 refills | Status: DC

## 2019-04-23 NOTE — Discharge Instructions (Addendum)
You were evaluated in the Emergency Department and after careful evaluation, we did not find any emergent condition requiring admission or further testing in the hospital.  Your symptoms seem to be due to gout.  We discussed your case in your recent lab results with the orthopedic specialist, who would like to see you in clinic soon.  Please call the number provided to schedule an appointment.  Please take the steroid taper medication as directed.  Please return to the Emergency Department if you experience any worsening of your condition.  We encourage you to follow up with a primary care provider.  Thank you for allowing [place] to be a part of your care.

## 2019-04-23 NOTE — ED Triage Notes (Signed)
Pt states he had fluid drawn off right knee last week-was advised today by PCP to come to ED for abx due to fluid results-pt sates he is taking po abx-NAD-steady gait

## 2019-04-23 NOTE — ED Provider Notes (Signed)
[facility] Emergency Department Provider Note MRN:  [id number]  Arrival date & time: 04/23/19     Chief Complaint   Knee Pain   History of Present Illness   Michael Hays is a 49 y.o. year-old male with a history of gout presenting to the ED with chief complaint of knee pain.  Patient explains that he has had a a lot of issues with his knees and joints over the years.  He began having pain to the right knee 8 days ago.  Had some fluid drained from the knee in his family medicine office few days ago.  He was called today with the results and told to go to the emergency department.  He has been on ciprofloxacin for the past few days.  He took some old prednisone and has been taking prednisone for the past 3 days with improvement in his pain.  He denies fever, no other complaints.  Pain in the right knee is currently mild, worse with motion.  Review of Systems  A complete 10 system review of systems was obtained and all systems are negative except as noted in the HPI and PMH.   Patient's Health History    Past Medical History:  Diagnosis Date  . Allergy   . GERD (gastroesophageal reflux disease)   . Headache(784.0)   . Hyperlipidemia   . Hypertension     History reviewed. No pertinent surgical history.  Family History  Problem Relation Age of Onset  . Diabetes Mother   . Early death Mother 79  . Coronary artery disease Other     Social History   Socioeconomic History  . Marital status: Married    Spouse name: Not on file  . Number of children: Not on file  . Years of education: Not on file  . Highest education level: Not on file  Occupational History  . Occupation: Games developer  Tobacco Use  . Smoking status: Never Smoker  . Smokeless tobacco: Current User    Types: Chew  Substance and Sexual Activity  . Alcohol use: Yes    Alcohol/week: 7.0 standard drinks    Types: 7 Standard drinks or equivalent per week  . Drug use: Not on file  .  Sexual activity: Yes  Other Topics Concern  . Not on file  Social History Narrative  . Not on file   Social Determinants of Health   Financial Resource Strain:   . Difficulty of Paying Living Expenses: Not on file  Food Insecurity:   . Worried About Charity fundraiser in the Last Year: Not on file  . Ran Out of Food in the Last Year: Not on file  Transportation Needs:   . Lack of Transportation (Medical): Not on file  . Lack of Transportation (Non-Medical): Not on file  Physical Activity:   . Days of Exercise per Week: Not on file  . Minutes of Exercise per Session: Not on file  Stress:   . Feeling of Stress : Not on file  Social Connections:   . Frequency of Communication with Friends and Family: Not on file  . Frequency of Social Gatherings with Friends and Family: Not on file  . Attends Religious Services: Not on file  . Active Member of Clubs or Organizations: Not on file  . Attends Archivist Meetings: Not on file  . Marital Status: Not on file  Intimate Partner Violence:   . Fear of Current or Ex-Partner: Not on file  .  Emotionally Abused: Not on file  . Physically Abused: Not on file  . Sexually Abused: Not on file     Physical Exam  Vital Signs and Nursing Notes reviewed Vitals:   04/23/19 1834 04/23/19 2058  BP: (!) 175/111 (!) 166/109  Pulse: 66 68  Resp: 18 15  Temp:    SpO2: 99% 99%    CONSTITUTIONAL: Well-appearing, NAD NEURO:  Alert and oriented x 3, no focal deficits EYES:  eyes equal and reactive ENT/NECK:  no LAD, no JVD CARDIO: Regular rate, well-perfused, normal S1 and S2 PULM:  CTAB no wheezing or rhonchi GI/GU:  normal bowel sounds, non-distended, non-tender MSK/SPINE:  No gross deformities, moderate right knee effusion palpable on exam, mild increased warmth, no erythema, range of motion of the right knee is mildly limited but largely preserved SKIN:  no rash, atraumatic PSYCH:  Appropriate speech and behavior  Diagnostic and  Interventional Summary    EKG Interpretation  Date/Time:    Ventricular Rate:    PR Interval:    QRS Duration:   QT Interval:    QTC Calculation:   R Axis:     Text Interpretation:        Labs Reviewed - No data to display  No orders to display    Medications - No data to display   Procedures  /  Critical Care Procedures  ED Course and Medical Decision Making  I have reviewed the triage vital signs, the nursing notes, and pertinent available records from the EMR.  Pertinent labs & imaging results that were available during my care of the patient were reviewed by me and considered in my medical decision making (see below for details).     Patient sent here for abnormal synovial fluid analysis.  59,000 white blood cells, mono urate crystals, no growth on culture after 56 to 72 hours.  Patient's vital signs are normal, he has no erythema, he has fairly preserved range of motion, he has persistent effusion.  Exam seems inconsistent with septic joint, especially given the fact that he has been having this pain for over a week and I would suspect greater joint destruction, pain, worsened appearance with a septic joint.  He is improving on steroids, which he self administered.  This is of course a mixed picture because he has also been taking antibiotics and his white count is above 50,000.  Case discussed with Dr. Erlinda Hong of orthopedics, who confirms that he is seen cases of gout with white blood cell counts greater than 50,000 and would therefore treat for gout especially given the clinical appearance.  Will follow up in Dr. Phoebe Sharps clinic.    Barth Kirks. Sedonia Small, [place] [email or web address]  Final Clinical Impressions(s) / ED Diagnoses     ICD-10-CM   1. Effusion of right knee  M25.461   2. Acute pain of right knee  M25.561     ED Discharge Orders         Ordered    predniSONE (DELTASONE) 10 MG tablet     04/23/19 2207             Discharge Instructions Discussed with and Provided to Patient:     Discharge Instructions     You were evaluated in the Emergency Department and after careful evaluation, we did not find any emergent condition requiring admission or further testing in the hospital.  Your symptoms seem to be due to gout.  We discussed your case  in your recent lab results with the orthopedic specialist, who would like to see you in clinic soon.  Please call the number provided to schedule an appointment.  Please take the steroid taper medication as directed.  Please return to the Emergency Department if you experience any worsening of your condition.  We encourage you to follow up with a primary care provider.  Thank you for allowing [place] to be a part of your care.      Maudie Flakes, MD 04/23/19 2213

## 2019-04-25 MED FILL — REPATHA 140 MG/ML SOSY: 140 | 28 days supply | Qty: 2 | Fill #8

## 2019-04-25 MED FILL — EZETIMIBE 10 MG TABS: 10 | 90 days supply | Qty: 90 | Fill #1

## 2019-04-29 LAB — SYNOVIAL FLUID PANEL
Eos, Fluid: 0 %
Glucose, Fluid: <2 mg/dL
Lining Cells, Synovial: 0 %
Lymphs, Fluid: 7 %
Macrophages Fld: 3 %
Nuc cell # Fld: 59550 cells/uL — ABNORMAL HIGH (ref 0–200)
Polys, Fluid: 90 %
Protein, Fluid: 5.2 g/dL
RBC, Fluid: 9000 /uL

## 2019-05-01 ENCOUNTER — Other Ambulatory Visit: Payer: Self-pay

## 2019-05-01 ENCOUNTER — Encounter: Payer: Self-pay | Admitting: Orthopaedic Surgery

## 2019-05-01 ENCOUNTER — Ambulatory Visit (INDEPENDENT_AMBULATORY_CARE_PROVIDER_SITE_OTHER): Payer: 59 | Admitting: Orthopaedic Surgery

## 2019-05-01 DIAGNOSIS — M25561 Pain in right knee: Secondary | ICD-10-CM | POA: Diagnosis not present

## 2019-05-01 DIAGNOSIS — M109 Gout, unspecified: Secondary | ICD-10-CM | POA: Diagnosis not present

## 2019-05-01 MED ORDER — BUPIVACAINE HCL 0.25 % IJ SOLN
2.0000 mL | INTRAMUSCULAR | Status: AC | PRN
  Administered 2019-05-01: 16:00:00 2 mL via INTRA_ARTICULAR

## 2019-05-01 MED ORDER — METHYLPREDNISOLONE ACETATE 40 MG/ML IJ SUSP
40.0000 mg | INTRAMUSCULAR | Status: AC | PRN
  Administered 2019-05-01: 16:00:00 40 mg via INTRA_ARTICULAR

## 2019-05-01 MED ORDER — LIDOCAINE HCL 1 % IJ SOLN
2.0000 mL | INTRAMUSCULAR | Status: AC | PRN
  Administered 2019-05-01: 2 mL

## 2019-05-01 NOTE — Progress Notes (Signed)
Office Visit Note   Patient: Michael Hays           Date of Birth: 09-01-1970           MRN: [id number] Visit Date: 05/01/2019              Requested by: Dettinger, Fransisca Kaufmann, MD [place],  [place] [address] PCP: Dettinger, Fransisca Kaufmann, MD   Assessment & Plan: Visit Diagnoses:  1. Acute gout of right knee, unspecified cause     Plan: Impression is right knee gouty attack.  Today, aspirated 55 cc of serosanguineous fluid from the right knee.  I then injected the right knee with cortisone.  He is to take it easy over the next few days.  Ice and elevate as needed.  He will follow-up with his primary care provider to further discuss prophylactic treatment.  Follow-up with [place] as needed.  Follow-Up Instructions: Return if symptoms worsen or fail to improve.   Orders:  Orders Placed This Encounter  Procedures  . Large Joint Inj: R knee   No orders of the defined types were placed in this encounter.     Procedures: Large Joint Inj: R knee on 05/01/2019 3:38 PM Indications: pain Details: 22 G needle, anterolateral approach Medications: 2 mL lidocaine 1 %; 2 mL bupivacaine 0.25 %; 40 mg methylPREDNISolone acetate 40 MG/ML      Clinical Data: No additional findings.   Subjective: Chief Complaint  Patient presents with  . Right Knee - Pain, Follow-up    HPI patient is a pleasant 49 year old gentleman who presents our clinic today for follow-up of right knee pain.  He notes that he has had intermittent right knee pain since he was a teenager.  He has had occasional cortisone injections with moderate relief of symptoms.  He does note that he had a flareup recently and was placed on prednisone.  During the taper, he was horse playing with his daughter when she landed on his right knee.  He had significant pain was seen by his PCP.  His knee was swollen and was therefore aspirated.  Aspirate was sent off which showed a cell count of 59,550.  Cultures have been negative to  date.  He was sent to the ED by his PCP on 04/23/2019 due to possible infection.  He was placed on prednisone which she he is currently on.  He notes moderate improvement of symptoms but not complete resolution.  He does note a significant history of gouty attacks to his toe, ankle as well as his right knee.  He has been on prophylactic medications but these have affected his kidneys so he no longer takes these.  Review of Systems as detailed in HPI.  All others reviewed and are negative.   Objective: Vital Signs: There were no vitals taken for this visit.  Physical Exam well-developed well-nourished gentleman in no acute distress.  Alert oriented x3.  Ortho Exam examination of his right knee reveals a 1+ effusion.  Range of motion 0 to 100 degrees.  Mild medial lateral joint line tenderness.  Mild patellofemoral crepitus.  He is neurovascular intact distally.  Specialty Comments:  No specialty comments available.  Imaging: No new imaging   PMFS History: Patient Active Problem List   Diagnosis Date Noted  . Chronic pain of right knee 04/20/2017  . Gout 11/10/2015  . Transient synovitis of right knee 01/01/2015  . LOC OSTEOARTHROS NOT SPEC PRIM/SEC LOWER LEG 01/29/2009  . HLD (  hyperlipidemia) 11/17/2006  . Essential hypertension 11/17/2006  . GERD 11/17/2006   Past Medical History:  Diagnosis Date  . Allergy   . GERD (gastroesophageal reflux disease)   . Headache(784.0)   . Hyperlipidemia   . Hypertension     Family History  Problem Relation Age of Onset  . Diabetes Mother   . Early death Mother 38  . Coronary artery disease Other     History reviewed. No pertinent surgical history. Social History   Occupational History  . Occupation: Games developer  Tobacco Use  . Smoking status: Never Smoker  . Smokeless tobacco: Current User    Types: Chew  Substance and Sexual Activity  . Alcohol use: Yes    Alcohol/week: 7.0 standard drinks    Types: 7 Standard drinks or  equivalent per week  . Drug use: Not on file  . Sexual activity: Yes

## 2019-05-07 MED FILL — NADOLOL 40 MG TABS: 40 | 90 days supply | Qty: 90 | Fill #2

## 2019-05-28 MED FILL — REPATHA 140 MG/ML SOSY: 140 | 28 days supply | Qty: 2 | Fill #9

## 2019-06-01 ENCOUNTER — Other Ambulatory Visit: Payer: Self-pay | Admitting: Family Medicine

## 2019-07-02 MED FILL — REPATHA 140 MG/ML SOSY: 140 | 28 days supply | Qty: 2 | Fill #10

## 2019-07-03 ENCOUNTER — Encounter: Payer: Self-pay | Admitting: *Deleted

## 2019-07-30 MED FILL — REPATHA 140 MG/ML SOSY: 140 | 28 days supply | Qty: 2 | Fill #11

## 2019-08-06 ENCOUNTER — Telehealth: Payer: Self-pay | Admitting: *Deleted

## 2019-08-06 NOTE — Telephone Encounter (Signed)
PA started today for Repatha  Key : [id number]

## 2019-08-06 NOTE — Telephone Encounter (Signed)
The request has been approved Pt wife aware and he currently has the med  Pharm will not run again until next RF

## 2019-08-07 MED FILL — LOSARTAN POTASSIUM 50 MG TA: 50 | 90 days supply | Qty: 90 | Fill #1

## 2019-08-07 MED FILL — NADOLOL 40 MG TABS: 40 | 90 days supply | Qty: 90 | Fill #3

## 2019-08-13 ENCOUNTER — Encounter: Payer: Self-pay | Admitting: Family Medicine

## 2019-08-13 ENCOUNTER — Other Ambulatory Visit: Payer: Self-pay

## 2019-08-13 ENCOUNTER — Ambulatory Visit: Payer: 59 | Admitting: Family Medicine

## 2019-08-13 VITALS — BP 175/91 | HR 75 | Temp 98.1°F | Ht 69.0 in | Wt 231.0 lb

## 2019-08-13 DIAGNOSIS — K219 Gastro-esophageal reflux disease without esophagitis: Secondary | ICD-10-CM

## 2019-08-13 DIAGNOSIS — I10 Essential (primary) hypertension: Secondary | ICD-10-CM | POA: Diagnosis not present

## 2019-08-13 DIAGNOSIS — M109 Gout, unspecified: Secondary | ICD-10-CM

## 2019-08-13 DIAGNOSIS — E782 Mixed hyperlipidemia: Secondary | ICD-10-CM

## 2019-08-13 MED ORDER — FEBUXOSTAT 40 MG PO TABS: 40.0000 mg | ORAL_TABLET | Freq: Every day | ORAL | 3 refills | Status: DC

## 2019-08-13 NOTE — Progress Notes (Signed)
BP (!) 175/91   Pulse 75   Temp 98.1 F (36.7 C)   Ht 5' 9"  (1.753 m)   Wt 231 lb (104.8 kg)   SpO2 97%   BMI 34.11 kg/m    Subjective:   Patient ID: Michael Hays, male    DOB: 20-Mar-1971, 49 y.o.   MRN: [id number]  HPI: Michael Hays is a 49 y.o. male presenting on 08/13/2019 for Medical Management of Chronic Issues (53m, Hyperlipidemia, and Hypertension   HPI Hypertension Patient is currently on amlodipine and losartan and nadolol, and their blood pressure today is 175/91. Patient denies any lightheadedness or dizziness. Patient denies headaches, blurred vision, chest pains, shortness of breath, or weakness. Denies any side effects from medication and is content with current medication.   Gout Last attack: Frequently, gets them every few days.  Has seen orthopedic for this. Attacks this year: Many Medication: Colchicine as needed Location of attacks: Feet and ankles knees  Hyperlipidemia Patient is coming in for recheck of his hyperlipidemia. The patient is currently taking Repatha, was on Zetia.. They deny any issues with myalgias or history of liver damage from it. They deny any focal numbness or weakness or chest pain.   GERD Patient is currently on no medication currently.  She denies any major symptoms or abdominal pain or belching or burping. She denies any blood in her stool or lightheadedness or dizziness.   Relevant past medical, surgical, family and social history reviewed and updated as indicated. Interim medical history since our last visit reviewed. Allergies and medications reviewed and updated.  Review of Systems  Constitutional: Negative for chills and fever.  Eyes: Negative for visual disturbance.  Respiratory: Negative for shortness of breath and wheezing.   Cardiovascular: Negative for chest pain and leg swelling.  Musculoskeletal: Negative for back pain and gait problem.  Skin: Negative for rash.  Neurological: Negative for dizziness, weakness and  numbness.  All other systems reviewed and are negative.   Per HPI unless specifically indicated above   Allergies as of 08/13/2019      Reactions   Atenolol    Codeine    Hydrocodone-acetaminophen       Medication List       Accurate as of Aug 13, 2019  9:02 AM. If you have any questions, ask your nurse or doctor.        STOP taking these medications   lisinopril 10 MG tablet Commonly known as: ZESTRIL Stopped by: JWorthy Rancher MD   predniSONE 10 MG tablet Commonly known as: DELTASONE Stopped by: JFransisca KaufmannDettinger, MD   predniSONE 20 MG tablet Commonly known as: DELTASONE Stopped by: JFransisca KaufmannDettinger, MD     TAKE these medications   amLODipine 5 MG tablet Commonly known as: NORVASC Take 1 tablet (5 mg total) by mouth 2 (two) times daily. What changed: when to take this   ezetimibe 10 MG tablet Commonly known as: ZETIA Take 1 tablet (10 mg total) by mouth daily.   losartan 50 MG tablet Commonly known as: COZAAR Take 50 mg by mouth daily.   nadolol 40 MG tablet Commonly known as: CORGARD TAKE ONE TABLET BY MOUTH EVERY DAY   naproxen 500 MG tablet Commonly known as: Naprosyn Take 1 tablet (500 mg total) by mouth 2 (two) times daily with a meal.   Repatha 140 MG/ML Sosy Generic drug: Evolocumab Inject 140 mg into the skin every 14 (fourteen) days.  Objective:   BP (!) 175/91   Pulse 75   Temp 98.1 F (36.7 C)   Ht 5' 9"  (1.753 m)   Wt 231 lb (104.8 kg)   SpO2 97%   BMI 34.11 kg/m   Wt Readings from Last 3 Encounters:  08/13/19 231 lb (104.8 kg)  04/18/19 220 lb (99.8 kg)  03/12/19 217 lb 3.2 oz (98.5 kg)    Physical Exam Vitals and nursing note reviewed.  Constitutional:      General: He is not in acute distress.    Appearance: He is well-developed. He is not diaphoretic.  Eyes:     General: No scleral icterus.    Conjunctiva/sclera: Conjunctivae normal.  Neck:     Thyroid: No thyromegaly.  Cardiovascular:     Rate  and Rhythm: Normal rate and regular rhythm.     Heart sounds: Normal heart sounds. No murmur.  Pulmonary:     Effort: Pulmonary effort is normal. No respiratory distress.     Breath sounds: Normal breath sounds. No wheezing.  Musculoskeletal:        General: Normal range of motion.     Cervical back: Neck supple.  Lymphadenopathy:     Cervical: No cervical adenopathy.  Skin:    General: Skin is warm and dry.     Findings: No rash.  Neurological:     Mental Status: He is alert and oriented to person, place, and time.     Coordination: Coordination normal.  Psychiatric:        Behavior: Behavior normal.       Assessment & Plan:   Problem List Items Addressed This Visit      Cardiovascular and Mediastinum   Essential hypertension   Relevant Medications   losartan (COZAAR) 50 MG tablet   Other Relevant Orders   CMP14+EGFR     Digestive   GERD   Relevant Orders   CBC with Differential/Platelet     Other   HLD (hyperlipidemia) - Primary   Relevant Medications   losartan (COZAAR) 50 MG tablet   Other Relevant Orders   Lipid panel   Gout   Relevant Orders   Uric acid      Increase amlodipine to twice a day, he never started it that way. Follow up plan: Return in about 3 months (around 11/13/2019), or if symptoms worsen or fail to improve, for Hypertension and GERD.  Counseling provided for all of the vaccine components No orders of the defined types were placed in this encounter.   Caryl Pina, MD [facility] 08/13/2019, 9:02 AM

## 2019-08-14 LAB — CMP14+EGFR
ALT: 37 IU/L (ref 0–44)
AST: 31 IU/L (ref 0–40)
Albumin/Globulin Ratio: 1.3 (ref 1.2–2.2)
Albumin: 4.3 g/dL (ref 4.0–5.0)
Alkaline Phosphatase: 82 IU/L (ref 39–117)
BUN/Creatinine Ratio: 16 (ref 9–20)
BUN: 13 mg/dL (ref 6–24)
Bilirubin Total: 0.3 mg/dL (ref 0.0–1.2)
CO2: 21 mmol/L (ref 20–29)
Calcium: 9.7 mg/dL (ref 8.7–10.2)
Chloride: 104 mmol/L (ref 96–106)
Creatinine, Ser: 0.79 mg/dL (ref 0.76–1.27)
GFR calc Af Amer: 123 mL/min/{1.73_m2} (ref >59)
GFR calc non Af Amer: 106 mL/min/{1.73_m2} (ref >59)
Globulin, Total: 3.2 g/dL (ref 1.5–4.5)
Glucose: 103 mg/dL — ABNORMAL HIGH (ref 65–99)
Potassium: 4.8 mmol/L (ref 3.5–5.2)
Sodium: 140 mmol/L (ref 134–144)
Total Protein: 7.5 g/dL (ref 6.0–8.5)

## 2019-08-14 LAB — LIPID PANEL
Chol/HDL Ratio: 2.4 ratio (ref 0.0–5.0)
Cholesterol, Total: 152 mg/dL (ref 100–199)
HDL: 63 mg/dL (ref >39)
LDL Chol Calc (NIH): 67 mg/dL (ref 0–99)
Triglycerides: 127 mg/dL (ref 0–149)
VLDL Cholesterol Cal: 22 mg/dL (ref 5–40)

## 2019-08-14 LAB — CBC WITH DIFFERENTIAL/PLATELET
Basophils Absolute: 0.1 10*3/uL (ref 0.0–0.2)
Basos: 2 %
EOS (ABSOLUTE): 0.2 10*3/uL (ref 0.0–0.4)
Eos: 3 %
Hematocrit: 46.8 % (ref 37.5–51.0)
Hemoglobin: 15.9 g/dL (ref 13.0–17.7)
Immature Grans (Abs): 0.1 10*3/uL (ref 0.0–0.1)
Immature Granulocytes: 1 %
Lymphocytes Absolute: 1.3 10*3/uL (ref 0.7–3.1)
Lymphs: 19 %
MCH: 29.8 pg (ref 26.6–33.0)
MCHC: 34 g/dL (ref 31.5–35.7)
MCV: 88 fL (ref 79–97)
Monocytes Absolute: 0.9 10*3/uL (ref 0.1–0.9)
Monocytes: 13 %
Neutrophils Absolute: 4.4 10*3/uL (ref 1.4–7.0)
Neutrophils: 62 %
Platelets: 262 10*3/uL (ref 150–450)
RBC: 5.34 x10E6/uL (ref 4.14–5.80)
RDW: 12.5 % (ref 11.6–15.4)
WBC: 6.9 10*3/uL (ref 3.4–10.8)

## 2019-08-14 LAB — URIC ACID: Uric Acid: 8.1 mg/dL (ref 3.8–8.4)

## 2019-08-16 ENCOUNTER — Telehealth: Payer: Self-pay | Admitting: Family Medicine

## 2019-08-16 MED ORDER — PREDNISONE 20 MG PO TABS: ORAL_TABLET | ORAL | 0 refills | Status: DC

## 2019-08-16 NOTE — Telephone Encounter (Signed)
Pt aware.

## 2019-08-16 NOTE — Telephone Encounter (Signed)
I sent prednisone for the patient.

## 2019-08-16 NOTE — Telephone Encounter (Signed)
Pt called requesting Rx for Prednisone for gout in his foot. Says he is unable to walk.   Michael Hays

## 2019-08-20 ENCOUNTER — Ambulatory Visit: Payer: 59 | Admitting: Family Medicine

## 2019-08-20 ENCOUNTER — Other Ambulatory Visit: Payer: Self-pay

## 2019-08-20 ENCOUNTER — Encounter: Payer: Self-pay | Admitting: Family Medicine

## 2019-08-20 VITALS — BP 149/90 | HR 79 | Temp 98.4°F | Ht 69.0 in | Wt 230.0 lb

## 2019-08-20 DIAGNOSIS — M10071 Idiopathic gout, right ankle and foot: Secondary | ICD-10-CM | POA: Diagnosis not present

## 2019-08-20 MED ORDER — PREDNISONE 20 MG PO TABS: ORAL_TABLET | ORAL | 0 refills | Status: DC

## 2019-08-20 MED ORDER — METHYLPREDNISOLONE ACETATE 80 MG/ML IJ SUSP
80.0000 mg | Freq: Once | INTRAMUSCULAR | Status: AC
  Administered 2019-08-20: 80 mg via INTRAMUSCULAR

## 2019-08-20 NOTE — Progress Notes (Signed)
BP (!) 149/90   Pulse 79   Temp 98.4 F (36.9 C) (Temporal)   Ht 5\' 9"  (1.753 m)   Wt 230 lb (104.3 kg)   BMI 33.97 kg/m    Subjective:   Patient ID: Michael Hays, male    DOB: Jul 26, 1970, 49 y.o.   MRN: [id number]  HPI: Michael Hays is a 49 y.o. male presenting on 08/20/2019 for Foot Pain (pt here today c/o right foot pain )   HPI Patient is coming in today for right foot pain that is related to his gout.  He tried the Uloric for 2 days and it seemed to have gotten worse.  He has more inflammation.  He has been trying to do his colchicine and steroids.  He says they have been helping but it has been getting worse since taking the Uloric.  He did stop the Uloric.  Relevant past medical, surgical, family and social history reviewed and updated as indicated. Interim medical history since our last visit reviewed. Allergies and medications reviewed and updated.  Review of Systems  Constitutional: Negative for chills and fever.  Respiratory: Negative for shortness of breath and wheezing.   Cardiovascular: Negative for chest pain and leg swelling.  Musculoskeletal: Positive for arthralgias and joint swelling. Negative for back pain and gait problem.  Skin: Positive for color change. Negative for rash and wound.  All other systems reviewed and are negative.   Per HPI unless specifically indicated above   Allergies as of 08/20/2019      Reactions   Atenolol    Codeine    Hydrocodone-acetaminophen       Medication List       Accurate as of Aug 20, 2019  4:41 PM. If you have any questions, ask your nurse or doctor.        STOP taking these medications   febuxostat 40 MG tablet Commonly known as: Uloric Stopped by: Fransisca Kaufmann Latesa Fratto, MD     TAKE these medications   amLODipine 5 MG tablet Commonly known as: NORVASC Take 1 tablet (5 mg total) by mouth 2 (two) times daily. What changed: when to take this   losartan 50 MG tablet Commonly known as: COZAAR Take 50 mg  by mouth daily.   nadolol 40 MG tablet Commonly known as: CORGARD TAKE ONE TABLET BY MOUTH EVERY DAY   naproxen 500 MG tablet Commonly known as: Naprosyn Take 1 tablet (500 mg total) by mouth 2 (two) times daily with a meal.   predniSONE 20 MG tablet Commonly known as: DELTASONE Take 3 tabs daily for 1 week, then 2 tabs daily for week 2, then 1 tab daily for week 3. What changed: additional instructions Changed by: Fransisca Kaufmann Vincenzo Stave, MD   Repatha 140 MG/ML Sosy Generic drug: Evolocumab Inject 140 mg into the skin every 14 (fourteen) days.        Objective:   BP (!) 149/90   Pulse 79   Temp 98.4 F (36.9 C) (Temporal)   Ht 5\' 9"  (1.753 m)   Wt 230 lb (104.3 kg)   BMI 33.97 kg/m   Wt Readings from Last 3 Encounters:  08/20/19 230 lb (104.3 kg)  08/13/19 231 lb (104.8 kg)  04/18/19 220 lb (99.8 kg)    Physical Exam Vitals and nursing note reviewed.  Constitutional:      Appearance: He is normal weight.  Skin:    Findings: Erythema present. No wound.       Neurological:  Mental Status: He is alert.       Assessment & Plan:   Problem List Items Addressed This Visit      Other   Gout - Primary   Relevant Medications   predniSONE (DELTASONE) 20 MG tablet      Gave Depo-Medrol injection and prednisone taper, will be higher and taper down this time. Follow up plan: Return if symptoms worsen or fail to improve.  Counseling provided for all of the vaccine components No orders of the defined types were placed in this encounter.   Caryl Pina, MD [facility] 08/20/2019, 4:41 PM

## 2019-09-17 ENCOUNTER — Other Ambulatory Visit: Payer: Self-pay | Admitting: Family Medicine

## 2019-09-17 ENCOUNTER — Other Ambulatory Visit: Payer: Self-pay | Admitting: *Deleted

## 2019-09-17 DIAGNOSIS — E782 Mixed hyperlipidemia: Secondary | ICD-10-CM

## 2019-09-17 DIAGNOSIS — Z9189 Other specified personal risk factors, not elsewhere classified: Secondary | ICD-10-CM

## 2019-09-17 MED ORDER — REPATHA 140 MG/ML ~~LOC~~ SOSY: 140.0000 mg | PREFILLED_SYRINGE | SUBCUTANEOUS | 1 refills | Status: DC

## 2019-09-17 MED FILL — REPATHA 140 MG/ML SOSY: 140 | 28 days supply | Qty: 2 | Fill #0

## 2019-09-24 ENCOUNTER — Other Ambulatory Visit: Payer: Self-pay | Admitting: Family Medicine

## 2019-09-24 DIAGNOSIS — I10 Essential (primary) hypertension: Secondary | ICD-10-CM

## 2019-09-24 MED FILL — AMLODIPINE BESYLATE 5 MG TA: 5 | 90 days supply | Qty: 180 | Fill #0

## 2019-10-24 MED FILL — REPATHA 140 MG/ML SOSY: 140 | 28 days supply | Qty: 2 | Fill #1

## 2019-10-30 ENCOUNTER — Other Ambulatory Visit: Payer: Self-pay | Admitting: Family Medicine

## 2019-10-30 DIAGNOSIS — I10 Essential (primary) hypertension: Secondary | ICD-10-CM

## 2019-10-30 MED FILL — LOSARTAN POTASSIUM 50 MG TA: 50 | 90 days supply | Qty: 90 | Fill #2

## 2019-10-30 MED FILL — NADOLOL 40 MG TABS: 40 | 90 days supply | Qty: 90 | Fill #0

## 2019-11-20 ENCOUNTER — Telehealth: Payer: Self-pay | Admitting: Family Medicine

## 2019-11-20 DIAGNOSIS — M10071 Idiopathic gout, right ankle and foot: Secondary | ICD-10-CM

## 2019-11-20 MED ORDER — PREDNISONE 20 MG PO TABS: 40.0000 mg | ORAL_TABLET | Freq: Every day | ORAL | 0 refills | Status: DC

## 2019-11-20 NOTE — Telephone Encounter (Signed)
°  Prescription Request  11/20/2019  What is the name of the medication or equipment? Prednisone for gout  Have you contacted your pharmacy to request a refill? (if applicable) no  Which pharmacy would you like this sent to? [facility]   Patient notified that their request is being sent to the clinical staff for review and that they should receive a response within 2 business days.

## 2019-11-20 NOTE — Telephone Encounter (Signed)
Wife aware

## 2019-11-20 NOTE — Telephone Encounter (Signed)
Pt has a current flare-up Please advise

## 2019-11-20 NOTE — Telephone Encounter (Signed)
Yes go ahead and send in prednisone 40 mg daily for 5 days

## 2019-11-26 MED FILL — REPATHA 140 MG/ML SOSY: 140 | 28 days supply | Qty: 2 | Fill #2

## 2020-01-01 MED FILL — REPATHA 140 MG/ML SOSY: 140 | 28 days supply | Qty: 2 | Fill #3

## 2020-01-02 ENCOUNTER — Other Ambulatory Visit: Payer: Self-pay

## 2020-01-02 ENCOUNTER — Ambulatory Visit: Payer: 59 | Admitting: Family Medicine

## 2020-01-02 ENCOUNTER — Encounter: Payer: Self-pay | Admitting: Family Medicine

## 2020-01-02 VITALS — BP 131/75 | HR 77 | Temp 98.0°F | Ht 69.0 in | Wt 227.0 lb

## 2020-01-02 DIAGNOSIS — M25461 Effusion, right knee: Secondary | ICD-10-CM | POA: Diagnosis not present

## 2020-01-02 DIAGNOSIS — M25561 Pain in right knee: Secondary | ICD-10-CM

## 2020-01-02 MED ORDER — METHYLPREDNISOLONE ACETATE 80 MG/ML IJ SUSP
80.0000 mg | Freq: Once | INTRAMUSCULAR | Status: AC
  Administered 2020-01-02: 80 mg via INTRAMUSCULAR

## 2020-01-02 NOTE — Progress Notes (Signed)
BP 131/75   Pulse 77   Temp 98 F (36.7 C)   Ht 5\' 9"  (1.753 m)   Wt 103 kg   SpO2 97%   BMI 33.52 kg/m    Subjective:   Patient ID: Michael Hays, male    DOB: Aug 10, 1970, 49 y.o.   MRN: [id number]  HPI: WYLAN GENTZLER is a 49 y.o. male presenting on 01/02/2020 for Knee Pain (right. Flared x2d. Nodule present)   HPI  Patient presents today for right knee pain and swelling in region of suprapatellar bursa. He states knee pain and swelling began 2 days ago; he denies trauma or other triggers, although he states he is very active in his job. He states the swelling has worsened over the past 2 days. He describes the pain as dull at rest and sharp with movement, which he rates a 7/10. He states he has taken ibuprofen today which helps slightly.   Patient states he has had many issues in the past with his right knee since age 43. He denies any history of knee injury but has had recurrent episodes of gout. Most recently, he had an episode of gout in the right knee in spring 2021. He states that this feels different than previous episodes of gout. He denies warmth, redness, radiation, joint laxity, fevers or chills.   Relevant past medical, surgical, family and social history reviewed and updated as indicated. Interim medical history since our last visit reviewed. Allergies and medications reviewed and updated.  Review of Systems  Constitutional: Negative for chills and fever.  Respiratory: Negative for shortness of breath.   Cardiovascular: Negative for chest pain and leg swelling.  Musculoskeletal: Positive for joint swelling. Negative for arthralgias.       Pain and swelling in right knee  Skin: Negative for color change, rash and wound.  Neurological: Positive for weakness.       Patient endorses weakness of right knee due to pain    Per HPI unless specifically indicated above   Allergies as of 01/02/2020      Reactions   Atenolol    Codeine    Hydrocodone-acetaminophen         Medication List       Accurate as of January 02, 2020  3:42 PM. If you have any questions, ask your nurse or doctor.        STOP taking these medications   naproxen 500 MG tablet Commonly known as: Naprosyn Stopped by: Worthy Rancher, MD   predniSONE 20 MG tablet Commonly known as: DELTASONE Stopped by: Fransisca Kaufmann Bradely Rudin, MD     TAKE these medications   amLODipine 5 MG tablet Commonly known as: NORVASC TAKE 1 TABLET BY MOUTH 2 TIMES DAILY.   ibuprofen 200 MG tablet Commonly known as: ADVIL Take 200 mg by mouth every 6 (six) hours as needed.   losartan 50 MG tablet Commonly known as: COZAAR Take 50 mg by mouth daily.   nadolol 40 MG tablet Commonly known as: CORGARD TAKE 1 TABLET BY MOUTH ONCE DAILY   Repatha 140 MG/ML Sosy Generic drug: Evolocumab Inject 140 mg into the skin every 14 (fourteen) days.        Objective:   BP 131/75   Pulse 77   Temp 98 F (36.7 C)   Ht 5\' 9"  (1.753 m)   Wt 103 kg   SpO2 97%   BMI 33.52 kg/m   Wt Readings from Last 3 Encounters:  01/02/20 103  kg  08/20/19 104.3 kg  08/13/19 104.8 kg    Physical Exam Constitutional:      General: He is not in acute distress.    Appearance: He is obese. He is not ill-appearing.  HENT:     Head: Normocephalic and atraumatic.  Cardiovascular:     Rate and Rhythm: Normal rate and regular rhythm.     Pulses: Normal pulses.     Heart sounds: Normal heart sounds.  Pulmonary:     Effort: Pulmonary effort is normal.     Breath sounds: Normal breath sounds.  Abdominal:     Palpations: Abdomen is soft.  Musculoskeletal:        General: Swelling and tenderness present. No signs of injury. Normal range of motion.     Right lower leg: No edema.     Left lower leg: No edema.     Comments: Moderate swelling and warmth in suprapatellar region. No erythema or rash. Area tender to palpation. ROM, sensation intact and strength 5/5 in b/l lower extremities; painful in right knee. No  joint laxity noted.  Skin:    General: Skin is warm and dry.     Findings: No bruising, erythema, lesion or rash.  Neurological:     Mental Status: He is alert.  Psychiatric:        Mood and Affect: Mood normal.        Behavior: Behavior normal.       Assessment & Plan:   Problem List Items Addressed This Visit    None    Visit Diagnoses    Pain and swelling of right knee    -  Primary   Relevant Medications   methylPREDNISolone acetate (DEPO-MEDROL) injection 80 mg (Completed)   Other Relevant Orders   CBC with Differential/Platelet   Uric acid       Follow up plan: Return if symptoms worsen or fail to improve.  Ddx includes gout, bursitis, effusion, septic arthritis. Gout most likely due to location, warmth, and patient's symptoms and history of gout flares. Will obtain CBC and uric acid. Patient provided with steroid injection in office. Also recommended to continue ibuprofen prn for pain and inflammation. Patient advised to follow up if symptoms worsen or do not improve.   Counseling provided for all of the vaccine components Orders Placed This Encounter  Procedures  . CBC with Differential/Platelet  . Uric acid   Patient seen and examined with Luella Cook, PA student, agree with assessment and plan above.  Likely think it is most likely gout in the suprapatellar bursitis, does not seem to be in the joint itself.  We will treat with methylprednisolone and do CBC and uric acid. Caryl Pina, MD [facility] 01/02/2020, 3:42 PM

## 2020-01-03 LAB — CBC WITH DIFFERENTIAL/PLATELET
Basophils Absolute: 0.1 10*3/uL (ref 0.0–0.2)
Basos: 1 %
EOS (ABSOLUTE): 0.2 10*3/uL (ref 0.0–0.4)
Eos: 2 %
Hematocrit: 47.7 % (ref 37.5–51.0)
Hemoglobin: 16.3 g/dL (ref 13.0–17.7)
Immature Grans (Abs): 0 10*3/uL (ref 0.0–0.1)
Immature Granulocytes: 1 %
Lymphocytes Absolute: 1.4 10*3/uL (ref 0.7–3.1)
Lymphs: 16 %
MCH: 30 pg (ref 26.6–33.0)
MCHC: 34.2 g/dL (ref 31.5–35.7)
MCV: 88 fL (ref 79–97)
Monocytes Absolute: 1.3 10*3/uL — ABNORMAL HIGH (ref 0.1–0.9)
Monocytes: 15 %
Neutrophils Absolute: 5.8 10*3/uL (ref 1.4–7.0)
Neutrophils: 65 %
Platelets: 293 10*3/uL (ref 150–450)
RBC: 5.43 x10E6/uL (ref 4.14–5.80)
RDW: 12.6 % (ref 11.6–15.4)
WBC: 8.8 10*3/uL (ref 3.4–10.8)

## 2020-01-03 LAB — URIC ACID: Uric Acid: 8.5 mg/dL — ABNORMAL HIGH (ref 3.8–8.4)

## 2020-01-08 ENCOUNTER — Telehealth: Payer: Self-pay

## 2020-01-08 NOTE — Telephone Encounter (Signed)
Colchicine recommended per labwork, medication not on current med list Please advise pt needs a 90d supply please

## 2020-01-08 NOTE — Telephone Encounter (Signed)
  Prescription Request  01/08/2020  What is the name of the medication or equipment?colchicine 0.6 MG tablet [[id number]] 58 day supply  Have you contacted your pharmacy to request a refill? (if applicable) yes  Which pharmacy would you like this sent to? [facility]   Patient notified that their request is being sent to the clinical staff for review and that they should receive a response within 2 business days.

## 2020-01-09 ENCOUNTER — Other Ambulatory Visit: Payer: Self-pay | Admitting: Family Medicine

## 2020-01-09 MED ORDER — COLCHICINE 0.6 MG PO TABS: 0.6000 mg | ORAL_TABLET | Freq: Every day | ORAL | 2 refills | Status: DC

## 2020-01-09 MED FILL — COLCHICINE 0.6 MG TABS: 0.6 | 90 days supply | Qty: 90 | Fill #0

## 2020-01-09 NOTE — Telephone Encounter (Signed)
Sent colchicine for the patient

## 2020-01-09 NOTE — Telephone Encounter (Signed)
Wife aware per dpr.  

## 2020-01-29 MED FILL — LOSARTAN POTASSIUM 50 MG TA: 50 | 90 days supply | Qty: 90 | Fill #3

## 2020-01-30 ENCOUNTER — Other Ambulatory Visit: Payer: Self-pay | Admitting: Family Medicine

## 2020-01-30 DIAGNOSIS — I10 Essential (primary) hypertension: Secondary | ICD-10-CM

## 2020-01-30 MED FILL — REPATHA 140 MG/ML SOSY: 140 | 28 days supply | Qty: 2 | Fill #4

## 2020-01-30 MED FILL — NADOLOL 40 MG TABS: 40 | 90 days supply | Qty: 90 | Fill #0

## 2020-03-04 MED FILL — REPATHA 140 MG/ML SOSY: 140 | 84 days supply | Qty: 6 | Fill #0

## 2020-03-13 MED FILL — AMLODIPINE BESYLATE 5 MG TA: 5 | 90 days supply | Qty: 180 | Fill #1

## 2020-03-23 MED FILL — COLCHICINE 0.6 MG TABS: 0.6 | 90 days supply | Qty: 90 | Fill #1

## 2020-04-11 ENCOUNTER — Telehealth: Payer: Self-pay

## 2020-04-11 NOTE — Telephone Encounter (Signed)
Mitzi,  This pt is going to need an appt to evaluate his knee again. We can order a MRI/Ortho referral in that visit if needed

## 2020-04-11 NOTE — Telephone Encounter (Signed)
REFERRAL REQUEST Telephone Note  Have you been seen at our office for this problem? YES (Advise that they may need an appointment with their PCP before a referral can be done)  Reason for Referral: Knee pain and fluid on knee Referral discussed with patient: YES Best contact number of patient for referral team: [phone]   Has patient been seen by a specialist for this issue before: YES Patient provider preference for referral: Wants MRI first and then go to [place] Patient location preference for referral: [place]   Patient notified that referrals can take up to a week or longer to process. If they haven't heard anything within a week they should call back and speak with the referral department.

## 2020-04-14 NOTE — Telephone Encounter (Signed)
Appointment made on 1-13 with Dettinger

## 2020-04-17 ENCOUNTER — Ambulatory Visit (INDEPENDENT_AMBULATORY_CARE_PROVIDER_SITE_OTHER): Payer: 59 | Admitting: Family Medicine

## 2020-04-17 ENCOUNTER — Ambulatory Visit (INDEPENDENT_AMBULATORY_CARE_PROVIDER_SITE_OTHER): Payer: 59

## 2020-04-17 ENCOUNTER — Encounter: Payer: Self-pay | Admitting: Family Medicine

## 2020-04-17 ENCOUNTER — Other Ambulatory Visit: Payer: Self-pay

## 2020-04-17 VITALS — BP 159/92 | HR 76 | Ht 69.0 in | Wt 228.0 lb

## 2020-04-17 DIAGNOSIS — G8929 Other chronic pain: Secondary | ICD-10-CM

## 2020-04-17 DIAGNOSIS — M25561 Pain in right knee: Secondary | ICD-10-CM | POA: Diagnosis not present

## 2020-04-17 DIAGNOSIS — M25461 Effusion, right knee: Secondary | ICD-10-CM | POA: Diagnosis not present

## 2020-04-17 DIAGNOSIS — M1711 Unilateral primary osteoarthritis, right knee: Secondary | ICD-10-CM | POA: Diagnosis not present

## 2020-04-17 MED ORDER — METHYLPREDNISOLONE ACETATE 80 MG/ML IJ SUSP
80.0000 mg | Freq: Once | INTRAMUSCULAR | Status: AC
  Administered 2020-04-17: 80 mg via INTRAMUSCULAR

## 2020-04-17 NOTE — Progress Notes (Signed)
BP (!) 159/92   Pulse 76   Ht 5\' 9"  (1.753 m)   Wt 228 lb (103.4 kg)   SpO2 97%   BMI 33.67 kg/m    Subjective:   Patient ID: Michael Hays, male    DOB: 18-May-1970, 50 y.o.   MRN: [id number]  HPI: Michael Hays is a 50 y.o. male presenting on 04/17/2020 for Leg Pain and Knee Pain (R>L)   HPI Pt comes in today with complaint of right knee pain that has been going on for several years. He states that the pain has been a gradual onset and he has been living with it ever since. In the past, he has had steroid injections which has provided some relief but the pain eventually returns. Previous providers have drawn synovial fluid and found evidence of gout crystals in the knee. Pain is reproducible on palpation of the medial and posterior aspects of the right knee as well as hyperextension of the joint. He states that since beginning the colchicine, his pain has been improving but lately has gotten worse and is interfering with his job. Pt reports having been outside playing with his niece a few weeks ago, after which the pain increased. He endorses occasional pain in both his hips and feet but it not currently experiencing any other pain. He denies numbness, tingling, back pain, constipation, n/v/d, weight loss, SOB, or chest pain. Valgus and varus stress tests were normal. Negative posterior and anterior drawer test. His last x-ray was one year ago for knee pain which did find some osteoarthritis.  Relevant past medical, surgical, family and social history reviewed and updated as indicated. Interim medical history since our last visit reviewed. Allergies and medications reviewed and updated.  Review of Systems  Constitutional: Negative.   HENT: Negative.   Eyes: Negative.   Respiratory: Negative.   Cardiovascular: Negative.   Endocrine: Negative.   Genitourinary: Negative.   Musculoskeletal: Positive for arthralgias, joint swelling and myalgias. Negative for back pain, gait problem, neck  pain and neck stiffness.  Skin: Negative.   Neurological: Positive for weakness (due to less use of his leg). Negative for dizziness and headaches.  Psychiatric/Behavioral: Negative.     Per HPI unless specifically indicated above   Allergies as of 04/17/2020      Reactions   Atenolol    Codeine    Hydrocodone-acetaminophen       Medication List       Accurate as of April 17, 2020 12:22 PM. If you have any questions, ask your nurse or doctor.        amLODipine 5 MG tablet Commonly known as: NORVASC TAKE 1 TABLET BY MOUTH 2 TIMES DAILY.   colchicine 0.6 MG tablet Take 1 tablet (0.6 mg total) by mouth daily.   ibuprofen 200 MG tablet Commonly known as: ADVIL Take 200 mg by mouth every 6 (six) hours as needed.   losartan 50 MG tablet Commonly known as: COZAAR Take 50 mg by mouth daily.   nadolol 40 MG tablet Commonly known as: CORGARD TAKE 1 TABLET BY MOUTH ONCE DAILY   Repatha 140 MG/ML Sosy Generic drug: Evolocumab Inject 140 mg into the skin every 14 (fourteen) days.        Objective:   BP (!) 159/92   Pulse 76   Ht 5\' 9"  (1.753 m)   Wt 228 lb (103.4 kg)   SpO2 97%   BMI 33.67 kg/m   Wt Readings from Last 3 Encounters:  04/17/20 228 lb (103.4 kg)  01/02/20 227 lb (103 kg)  08/20/19 230 lb (104.3 kg)    Physical Exam Constitutional:      Appearance: Normal appearance.  HENT:     Head: Normocephalic.  Eyes:     Pupils: Pupils are equal, round, and reactive to light.  Cardiovascular:     Rate and Rhythm: Normal rate and regular rhythm.     Pulses: Normal pulses.     Heart sounds: Normal heart sounds.  Pulmonary:     Effort: Pulmonary effort is normal.     Breath sounds: Normal breath sounds.  Abdominal:     General: Abdomen is flat.     Palpations: Abdomen is soft.  Musculoskeletal:        General: Swelling and tenderness (right knee) present.     Right knee: Swelling and effusion present. Decreased range of motion. Tenderness present  over the medial joint line and PCL. No LCL laxity, MCL laxity, ACL laxity or PCL laxity.     Instability Tests: Anterior drawer test negative. Posterior drawer test negative. Medial McMurray test negative and lateral McMurray test negative.     Left knee: No swelling or effusion. Normal range of motion. No tenderness.     Instability Tests: Anterior drawer test negative. Posterior drawer test negative.     Comments: Effusion to the right upper aspect of the knee. Tenderness to palpation of the medial and inferior aspects of the right knee.  Skin:    General: Skin is warm and dry.  Neurological:     General: No focal deficit present.     Mental Status: He is alert and oriented to person, place, and time.     Motor: No weakness.     Gait: Gait normal.  Psychiatric:        Mood and Affect: Mood normal.        Behavior: Behavior normal.       Assessment & Plan:   Problem List Items Addressed This Visit      Other   Chronic pain of right knee - Primary   Relevant Orders   MR Knee Right Wo Contrast   DG Knee 1-2 Views Right (Completed)       Follow up plan: Return if symptoms worsen or fail to improve.  Pt received methylprednisolone injection in the right upper quadrant of the buttocks for symptom relief. Pt also agreed to receive an x-ray today and will be scheduled for an MRI of the knee. He should return if symptoms return or fail to improve and will follow up after results of imaging arrive.  Counseling provided for all of the vaccine components Orders Placed This Encounter  Procedures  . MR Knee Right Wo Contrast  . DG Knee 1-2 Views Right   Silverio Decamp 04/17/2020  I was personally present for all components of the history, physical exam and/or medical decision making.  I agree with the documentation performed by the PA student and agree with assessment and plan above.  PA student was [employer] Caryl Pina, MD [facility] 04/27/2020, 9:53  PM    Caryl Pina, MD [facility] 04/17/2020, 12:22 PM

## 2020-05-01 ENCOUNTER — Other Ambulatory Visit: Payer: Self-pay | Admitting: Family Medicine

## 2020-05-01 DIAGNOSIS — I10 Essential (primary) hypertension: Secondary | ICD-10-CM

## 2020-05-01 MED FILL — NADOLOL 40 MG TABS: 40 | 90 days supply | Qty: 90 | Fill #0

## 2020-05-01 MED FILL — LOSARTAN POTASSIUM 50 MG TA: 50 | 90 days supply | Qty: 90 | Fill #0

## 2020-05-06 ENCOUNTER — Ambulatory Visit (HOSPITAL_COMMUNITY)
Admission: RE | Admit: 2020-05-06 | Discharge: 2020-05-06 | Disposition: A | Discharge status: Home / Self Care | Payer: 59 | Source: Ambulatory Visit | Attending: Family Medicine | Admitting: Family Medicine

## 2020-05-06 ENCOUNTER — Other Ambulatory Visit: Payer: Self-pay

## 2020-05-06 DIAGNOSIS — M25561 Pain in right knee: Secondary | ICD-10-CM | POA: Diagnosis not present

## 2020-05-06 DIAGNOSIS — G8929 Other chronic pain: Secondary | ICD-10-CM | POA: Insufficient documentation

## 2020-05-07 ENCOUNTER — Telehealth: Payer: Self-pay

## 2020-05-07 ENCOUNTER — Other Ambulatory Visit: Payer: Self-pay | Admitting: Family Medicine

## 2020-05-07 DIAGNOSIS — D492 Neoplasm of unspecified behavior of bone, soft tissue, and skin: Secondary | ICD-10-CM

## 2020-05-07 NOTE — Telephone Encounter (Signed)
[facility] called report wants to make sure provider aware of   Mass-like area of soft tissue nodularity arising from the lateral patellofemoral ligament/retinaculum located just above the superior margin of the patella measuring 3.4 x 1.6 x 2.5 cm. Differential includes focal nodular synovitis versus primary soft tissue neoplasm/sarcoma. Orthopedic evaluation and tissue sampling are Recommended.  Full report is in the chart please review and advise

## 2020-05-07 NOTE — Progress Notes (Unsigned)
Placed referral for patient stat, urgent for orthopedic for Dr. Wynelle Link

## 2020-05-29 DIAGNOSIS — M25561 Pain in right knee: Secondary | ICD-10-CM | POA: Diagnosis not present

## 2020-06-10 DIAGNOSIS — M25861 Other specified joint disorders, right knee: Secondary | ICD-10-CM | POA: Diagnosis not present

## 2020-06-10 DIAGNOSIS — M25461 Effusion, right knee: Secondary | ICD-10-CM | POA: Diagnosis not present

## 2020-06-10 DIAGNOSIS — M25561 Pain in right knee: Secondary | ICD-10-CM | POA: Diagnosis not present

## 2020-06-24 ENCOUNTER — Other Ambulatory Visit (HOSPITAL_BASED_OUTPATIENT_CLINIC_OR_DEPARTMENT_OTHER): Payer: Self-pay

## 2020-06-24 MED FILL — REPATHA 140 MG/ML SOSY: 140 | 84 days supply | Qty: 6 | Fill #1

## 2020-07-11 DIAGNOSIS — M25861 Other specified joint disorders, right knee: Secondary | ICD-10-CM | POA: Diagnosis not present

## 2020-07-11 DIAGNOSIS — M25561 Pain in right knee: Secondary | ICD-10-CM | POA: Diagnosis not present

## 2020-07-11 DIAGNOSIS — M1A0611 Idiopathic chronic gout, right knee, with tophus (tophi): Secondary | ICD-10-CM | POA: Diagnosis not present

## 2020-07-11 DIAGNOSIS — M25461 Effusion, right knee: Secondary | ICD-10-CM | POA: Diagnosis not present

## 2020-07-14 ENCOUNTER — Other Ambulatory Visit: Payer: Self-pay | Admitting: Family Medicine

## 2020-07-14 MED ORDER — PREDNISONE 20 MG PO TABS: ORAL_TABLET | ORAL | 0 refills | Status: DC

## 2020-07-14 NOTE — Progress Notes (Signed)
Patient is having gout and sent prednisone

## 2020-07-24 ENCOUNTER — Telehealth: Payer: Self-pay | Admitting: *Deleted

## 2020-07-24 NOTE — Telephone Encounter (Signed)
Michael Hays (Key: [id number])  Your information has been sent to [employer].  Repatha request.

## 2020-07-28 NOTE — Telephone Encounter (Signed)
he request has been approved. The authorization is effective for a maximum of 12 fills from 07/26/2020 to 07/25/2021, as long as the member is enrolled in their current health plan. The request was approved as submitted. A written notification letter will follow with additional details.  [facility] pharm aware

## 2020-07-29 ENCOUNTER — Other Ambulatory Visit (HOSPITAL_COMMUNITY): Payer: Self-pay

## 2020-08-05 ENCOUNTER — Other Ambulatory Visit (HOSPITAL_COMMUNITY): Payer: Self-pay

## 2020-08-05 ENCOUNTER — Telehealth: Payer: Self-pay | Admitting: *Deleted

## 2020-08-05 DIAGNOSIS — I10 Essential (primary) hypertension: Secondary | ICD-10-CM

## 2020-08-05 MED ORDER — NADOLOL 40 MG PO TABS
ORAL_TABLET | Freq: Every day | ORAL | 0 refills | Status: DC
  Filled 2020-08-05: qty 90, 90d supply, fill #0

## 2020-08-05 MED ORDER — LOSARTAN POTASSIUM 50 MG PO TABS
ORAL_TABLET | Freq: Every day | ORAL | 0 refills | Status: DC
Start: 2020-08-05 — End: 2020-10-24
  Filled 2020-08-05: qty 90, 90d supply, fill #0

## 2020-08-05 NOTE — Telephone Encounter (Signed)
Aware refills sent to pharmacy 

## 2020-08-18 ENCOUNTER — Other Ambulatory Visit: Payer: Self-pay

## 2020-08-18 ENCOUNTER — Encounter: Payer: Self-pay | Admitting: Nurse Practitioner

## 2020-08-18 ENCOUNTER — Ambulatory Visit (INDEPENDENT_AMBULATORY_CARE_PROVIDER_SITE_OTHER): Payer: 59 | Admitting: Nurse Practitioner

## 2020-08-18 DIAGNOSIS — R0981 Nasal congestion: Secondary | ICD-10-CM | POA: Diagnosis not present

## 2020-08-18 DIAGNOSIS — M791 Myalgia, unspecified site: Secondary | ICD-10-CM

## 2020-08-18 DIAGNOSIS — R059 Cough, unspecified: Secondary | ICD-10-CM

## 2020-08-18 LAB — VERITOR FLU A/B WAIVED
Influenza A: NEGATIVE
Influenza B: NEGATIVE

## 2020-08-18 MED ORDER — NAPROXEN 500 MG PO TABS: 500.0000 mg | ORAL_TABLET | Freq: Two times a day (BID) | ORAL | 1 refills | Status: DC

## 2020-08-18 MED ORDER — BENZONATATE 100 MG PO CAPS: 100.0000 mg | ORAL_CAPSULE | Freq: Three times a day (TID) | ORAL | 0 refills | Status: DC | PRN

## 2020-08-18 NOTE — Progress Notes (Signed)
Virtual Visit  Note Due to COVID-19 pandemic this visit was conducted virtually. This visit type was conducted due to national recommendations for restrictions regarding the COVID-19 Pandemic (e.g. social distancing, sheltering in place) in an effort to limit this patient's exposure and mitigate transmission in our community. All issues noted in this document were discussed and addressed.  A physical exam was not performed with this format.  I connected with Michael Hays on 08/18/20 at 4:05 by telephone and verified that I am speaking with the correct person using two identifiers. Michael Hays is currently located at home and no one is currently with  her during visit. The provider, Mary-Margaret Hassell Done, FNP is located in their office at time of visit.  I discussed the limitations, risks, security and privacy concerns of performing an evaluation and management service by telephone and the availability of in person appointments. I also discussed with the patient that there may be a patient responsible charge related to this service. The patient expressed understanding and agreed to proceed.   History and Present Illness:   Chief Complaint: Sinusitis   HPI patient calls in for an appointment. 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 He has no fever but has cold chills . Has a deep cough.    Review of Systems  Constitutional: Positive for chills, fever and malaise/fatigue.  HENT: Positive for congestion. Negative for sinus pain.   Respiratory: Positive for cough, sputum production and shortness of breath.   Musculoskeletal: Positive for myalgias.  Neurological: Negative for dizziness and headaches.  Psychiatric/Behavioral: Negative.   All other systems reviewed and are negative.    Observations/Objective: Alert and oriented- answers all questions appropriately No distress Voice hoarse Deep cough noted   Assessment and Plan: Michael Hays in today with chief complaint  of Sinusitis   1. Myalgia  Veritor Flu A/B Waived - Novel Coronavirus, NAA ([facility]); Future  2. Cough 3. Congestion of paranasal sinus 1. Take meds as prescribed 2. Use a cool mist humidifier especially during the winter months and when heat has been humid. 3. Use saline nose sprays frequently 4. Saline irrigations of the nose can be very helpful if done frequently.  * 4X daily for 1 week*  * Use of a nettie pot can be helpful with this. Follow directions with this* 5. Drink plenty of fluids 6. Keep thermostat turn down low 7.For any cough or congestion  Use plain Mucinex- regular strength or max strength is fine   * Children- consult with Pharmacist for dosing 8. For fever or aces or pains- take tylenol or ibuprofen appropriate for age and weight.  * for fevers greater than 101 orally you may alternate ibuprofen and tylenol every  3 hours.   Waiting on flu and covid results Meds ordered this encounter  Medications  . benzonatate (TESSALON PERLES) 100 MG capsule    Sig: Take 1 capsule (100 mg total) by mouth 3 (three) times daily as needed for cough.    Dispense:  20 capsule    Refill:  0    Order Specific Question:   Supervising Provider    Answer:   Caryl Pina A [id number]  . naproxen (NAPROSYN) 500 MG tablet    Sig: Take 1 tablet (500 mg total) by mouth 2 (two) times daily with a meal.    Dispense:  60 tablet    Refill:  1    Order Specific Question:   Supervising Provider    Answer:  DETTINGER, JOSHUA A [[id number]]           Follow Up Instructions: prn    I discussed the assessment and treatment plan with the patient. The patient was provided an opportunity to ask questions and all were answered. The patient agreed with the plan and demonstrated an understanding of the instructions.   The patient was advised to call back or seek an in-person evaluation if the symptoms worsen or if the condition fails to improve as anticipated.  The above assessment and  management plan was discussed with the patient. The patient verbalized understanding of and has agreed to the management plan. Patient is aware to call the clinic if symptoms persist or worsen. Patient is aware when to return to the clinic for a follow-up visit. Patient educated on when it is appropriate to go to the emergency department.   Time call ended:  4:17 I provided 12 minutes of  non face-to-face time during this encounter.    Mary-Margaret Hassell Done, FNP

## 2020-08-19 LAB — SARS-COV-2, NAA 2 DAY TAT

## 2020-08-19 LAB — NOVEL CORONAVIRUS, NAA: SARS-CoV-2, NAA: DETECTED — AB

## 2020-08-20 ENCOUNTER — Telehealth: Payer: Self-pay | Admitting: Unknown Physician Specialty

## 2020-08-20 NOTE — Telephone Encounter (Signed)
Called to discuss with patient about COVID-19 symptoms and the use of one of the available treatments for those with mild to moderate Covid symptoms and at a high risk of hospitalization.  Pt appears to qualify for outpatient treatment due to co-morbid conditions and/or a member of an at-risk group in accordance with the FDA Emergency Use Authorization.    Symptom onset: 5/16 Vaccinated: ?  Unable to reach pt - [place]

## 2020-08-30 ENCOUNTER — Other Ambulatory Visit (HOSPITAL_COMMUNITY): Payer: Self-pay

## 2020-08-30 MED FILL — Colchicine Tab 0.6 MG: ORAL | 90 days supply | Qty: 90 | Fill #0 | Status: AC

## 2020-09-08 ENCOUNTER — Telehealth: Payer: Self-pay | Admitting: Family Medicine

## 2020-09-08 ENCOUNTER — Other Ambulatory Visit (HOSPITAL_COMMUNITY): Payer: Self-pay

## 2020-09-08 NOTE — Telephone Encounter (Signed)
  Prescription Request  09/08/2020  What is the name of the medication or equipment? colchicine  Have you contacted your pharmacy to request a refill? (if applicable) yes  Which pharmacy would you like this sent to? [facility]   Patient notified that their request is being sent to the clinical staff for review and that they should receive a response within 2 business days.

## 2020-09-08 NOTE — Telephone Encounter (Signed)
Pt's had refills at pharmacy, it was ready for pickup, it was not marked for mail to pt Wife aware, pharmacy is mailing it.

## 2020-09-10 DIAGNOSIS — M25561 Pain in right knee: Secondary | ICD-10-CM | POA: Diagnosis not present

## 2020-09-15 ENCOUNTER — Other Ambulatory Visit (HOSPITAL_COMMUNITY): Payer: Self-pay

## 2020-09-25 ENCOUNTER — Other Ambulatory Visit: Payer: Self-pay

## 2020-09-25 ENCOUNTER — Ambulatory Visit (INDEPENDENT_AMBULATORY_CARE_PROVIDER_SITE_OTHER): Payer: 59 | Admitting: Physician Assistant

## 2020-09-25 ENCOUNTER — Encounter: Payer: Self-pay | Admitting: Physician Assistant

## 2020-09-25 VITALS — BP 165/89 | HR 77 | Temp 97.6°F | Resp 20 | Ht 69.0 in | Wt 228.0 lb

## 2020-09-25 DIAGNOSIS — Z0000 Encounter for general adult medical examination without abnormal findings: Secondary | ICD-10-CM | POA: Diagnosis not present

## 2020-09-25 NOTE — Progress Notes (Signed)
Subjective:     Patient ID: Michael Hays, male   DOB: 1970-06-08, 50 y.o.   MRN: [id number]  HPI Pt here for annual PE States he is doing well with no complaints at this time Pt with chronic gout/HTN Currently has f/u with ortho regarding gout/knee pain  Review of Systems  Constitutional: Negative.   HENT: Negative.    Eyes: Negative.   Respiratory: Negative.    Cardiovascular: Negative.   Gastrointestinal: Negative.   Endocrine: Negative.   Genitourinary: Negative.   Musculoskeletal:  Positive for arthralgias and joint swelling.  Neurological: Negative.   Psychiatric/Behavioral: Negative.        Objective:   Physical Exam Constitutional:      General: He is not in acute distress.    Appearance: Normal appearance. He is obese. He is not ill-appearing, toxic-appearing or diaphoretic.  HENT:     Head: Normocephalic.     Right Ear: Tympanic membrane, ear canal and external ear normal.     Left Ear: Tympanic membrane, ear canal and external ear normal.     Nose: Nose normal.     Mouth/Throat:     Mouth: Mucous membranes are moist.     Pharynx: Oropharynx is clear.  Eyes:     Extraocular Movements: Extraocular movements intact.     Conjunctiva/sclera: Conjunctivae normal.     Pupils: Pupils are equal, round, and reactive to light.  Neck:     Vascular: No carotid bruit.  Cardiovascular:     Rate and Rhythm: Normal rate and regular rhythm.     Pulses: Normal pulses.     Heart sounds: Normal heart sounds.  Pulmonary:     Effort: Pulmonary effort is normal.     Breath sounds: Normal breath sounds.  Abdominal:     General: Abdomen is flat. Bowel sounds are normal. There is no distension.     Palpations: Abdomen is soft. There is no mass.     Tenderness: There is no abdominal tenderness. There is no guarding or rebound.     Hernia: No hernia is present.  Musculoskeletal:        General: Deformity present. No tenderness. Normal range of motion.     Cervical back: Normal  range of motion and neck supple. No rigidity or tenderness.     Right lower leg: No edema.     Left lower leg: No edema.     Comments: Swelling to the lateral R knee  Lymphadenopathy:     Cervical: No cervical adenopathy.  Skin:    General: Skin is warm and dry.     Comments: Healing surgical scar to the lateral R knee  Neurological:     General: No focal deficit present.     Mental Status: He is alert and oriented to person, place, and time.  Psychiatric:        Mood and Affect: Mood normal.        Behavior: Behavior normal.        Thought Content: Thought content normal.        Judgment: Judgment normal.  EKG done today due to HTN hx CMP,Lipid,PSA pending     Assessment:     1. Annual physical exam        Plan:     Good diet and exercise reviewed Hydrate well Continue current meds Continue f/u with Ortho regarding Knee/Gout Will inform of lab results F/U pending labs Pt reports nl BP readings at home so will have him bring  those readings to be entered in to chart F/U in 6 months for BP

## 2020-09-25 NOTE — Patient Instructions (Signed)
Gout Gout is painful swelling of your joints. Gout is a type of arthritis. It is caused by having too much uric acid in your body. Uric acid is a chemical that is made when your body breaks down substances called purines. If your body has too much uric acid, sharp crystals can form and build up in your joints. This causes pain and swelling. Gout attacks can happen quickly and be very painful (acute gout). Over time, the attacks can affect more joints and happen more often (chronic gout). What are the causes? Too much uric acid in your blood. This can happen because: Your kidneys do not remove enough uric acid from your blood. Your body makes too much uric acid. You eat too many foods that are high in purines. These foods include organ meats, some seafood, and beer. Trauma or stress. What increases the risk? Having a family history of gout. Being male and middle-aged. Being male and having gone through menopause. Being very overweight (obese). Drinking alcohol, especially beer. Not having enough water in the body (being dehydrated). Losing weight too quickly. Having an organ transplant. Having lead poisoning. Taking certain medicines. Having kidney disease. Having a skin condition called psoriasis. What are the signs or symptoms? An attack of acute gout usually happens in just one joint. The most common place is the big toe. Attacks often start at night. Other joints that may be affected include joints of the feet, ankle, knee, fingers, wrist, or elbow. Symptoms of an attack may include: Very bad pain. Warmth. Swelling. Stiffness. Shiny, red, or purple skin. Tenderness. The affected joint may be very painful to touch. Chills and fever. Chronic gout may cause symptoms more often. More joints may be involved. You may also have white or yellow lumps (tophi) on your hands or feet or in other areas near your joints. How is this treated? Treatment for this condition has two phases:  treating an acute attack and preventing future attacks. Acute gout treatment may include: NSAIDs. Steroids. These are taken by mouth or injected into a joint. Colchicine. This medicine relieves pain and swelling. It can be given by mouth or through an IV tube. Preventive treatment may include: Taking small doses of NSAIDs or colchicine daily. Using a medicine that reduces uric acid levels in your blood. Making changes to your diet. You may need to see a food expert (dietitian) about what to eat and drink to prevent gout. Follow these instructions at home: During a gout attack  If told, put ice on the painful area: Put ice in a plastic bag. Place a towel between your skin and the bag. Leave the ice on for 20 minutes, 2-3 times a day. Raise (elevate) the painful joint above the level of your heart as often as you can. Rest the joint as much as possible. If the joint is in your leg, you may be given crutches. Follow instructions from your doctor about what you cannot eat or drink. Avoiding future gout attacks Eat a low-purine diet. Avoid foods and drinks such as: Liver. Kidney. Anchovies. Asparagus. Herring. Mushrooms. Mussels. Beer. Stay at a healthy weight. If you want to lose weight, talk with your doctor. Do not lose weight too fast. Start or continue an exercise plan as told by your doctor. Eating and drinking Drink enough fluids to keep your pee (urine) pale yellow. If you drink alcohol: Limit how much you use to: 0-1 drink a day for women. 0-2 drinks a day for men. Be aware of   how much alcohol is in your drink. In the [place] one drink equals one 12 oz bottle of beer (355 mL), one 5 oz glass of wine (148 mL), or one 1 oz glass of hard liquor (44 mL). General instructions Take over-the-counter and prescription medicines only as told by your doctor. Do not drive or use heavy machinery while taking prescription pain medicine. Return to your normal activities as told by your  doctor. Ask your doctor what activities are safe for you. Keep all follow-up visits as told by your doctor. This is important. Contact a doctor if: You have another gout attack. You still have symptoms of a gout attack after 10 days of treatment. You have problems (side effects) because of your medicines. You have chills or a fever. You have burning pain when you pee (urinate). You have pain in your lower back or belly. Get help right away if: You have very bad pain. Your pain cannot be controlled. You cannot pee. Summary Gout is painful swelling of the joints. The most common site of pain is the big toe, but it can affect other joints. Medicines and avoiding some foods can help to prevent and treat gout attacks. This information is not intended to replace advice given to you by your health care provider. Make sure you discuss any questions you have with your health care provider. Document Revised: 10/12/2017 Document Reviewed: 10/12/2017 Elsevier Patient Education  2022 Elsevier Inc.  

## 2020-09-26 LAB — CMP14+EGFR
ALT: 59 IU/L — ABNORMAL HIGH (ref 0–44)
AST: 48 IU/L — ABNORMAL HIGH (ref 0–40)
Albumin/Globulin Ratio: 1.8 (ref 1.2–2.2)
Albumin: 4.8 g/dL (ref 4.0–5.0)
Alkaline Phosphatase: 89 IU/L (ref 44–121)
BUN/Creatinine Ratio: 16 (ref 9–20)
BUN: 14 mg/dL (ref 6–24)
Bilirubin Total: 0.4 mg/dL (ref 0.0–1.2)
CO2: 24 mmol/L (ref 20–29)
Calcium: 10 mg/dL (ref 8.7–10.2)
Chloride: 99 mmol/L (ref 96–106)
Creatinine, Ser: 0.85 mg/dL (ref 0.76–1.27)
Globulin, Total: 2.6 g/dL (ref 1.5–4.5)
Glucose: 107 mg/dL — ABNORMAL HIGH (ref 65–99)
Potassium: 4.9 mmol/L (ref 3.5–5.2)
Sodium: 144 mmol/L (ref 134–144)
Total Protein: 7.4 g/dL (ref 6.0–8.5)
eGFR: 107 mL/min/{1.73_m2} (ref >59)

## 2020-09-26 LAB — LIPID PANEL
Chol/HDL Ratio: 2.7 ratio (ref 0.0–5.0)
Cholesterol, Total: 160 mg/dL (ref 100–199)
HDL: 59 mg/dL (ref >39)
LDL Chol Calc (NIH): 75 mg/dL (ref 0–99)
Triglycerides: 153 mg/dL — ABNORMAL HIGH (ref 0–149)
VLDL Cholesterol Cal: 26 mg/dL (ref 5–40)

## 2020-09-26 LAB — PSA: Prostate Specific Ag, Serum: 1.9 ng/mL (ref 0.0–4.0)

## 2020-10-02 ENCOUNTER — Other Ambulatory Visit: Payer: Self-pay

## 2020-10-02 DIAGNOSIS — R748 Abnormal levels of other serum enzymes: Secondary | ICD-10-CM

## 2020-10-10 DIAGNOSIS — M25561 Pain in right knee: Secondary | ICD-10-CM | POA: Diagnosis not present

## 2020-10-13 ENCOUNTER — Other Ambulatory Visit: Payer: Self-pay | Admitting: Family Medicine

## 2020-10-13 ENCOUNTER — Other Ambulatory Visit (HOSPITAL_COMMUNITY): Payer: Self-pay

## 2020-10-13 DIAGNOSIS — Z9189 Other specified personal risk factors, not elsewhere classified: Secondary | ICD-10-CM

## 2020-10-13 DIAGNOSIS — E782 Mixed hyperlipidemia: Secondary | ICD-10-CM

## 2020-10-13 MED ORDER — REPATHA 140 MG/ML ~~LOC~~ SOSY
PREFILLED_SYRINGE | SUBCUTANEOUS | 1 refills | Status: DC
  Filled 2020-10-13: qty 2, 28d supply, fill #0
  Filled 2020-11-18: qty 2, 28d supply, fill #1
  Filled 2020-12-15: qty 2, 28d supply, fill #2
  Filled 2021-01-19: qty 2, 28d supply, fill #3
  Filled 2021-03-07: qty 2, 28d supply, fill #4
  Filled 2021-04-04: qty 2, 28d supply, fill #5
  Filled 2021-04-29: qty 2, 28d supply, fill #6

## 2020-10-14 ENCOUNTER — Other Ambulatory Visit (HOSPITAL_COMMUNITY): Payer: Self-pay

## 2020-10-23 ENCOUNTER — Ambulatory Visit: Payer: 59 | Admitting: Family Medicine

## 2020-10-23 ENCOUNTER — Other Ambulatory Visit: Payer: Self-pay

## 2020-10-23 ENCOUNTER — Encounter: Payer: Self-pay | Admitting: Family Medicine

## 2020-10-23 VITALS — BP 179/101 | HR 82 | Ht 69.0 in | Wt 230.0 lb

## 2020-10-23 DIAGNOSIS — G8929 Other chronic pain: Secondary | ICD-10-CM

## 2020-10-23 DIAGNOSIS — R748 Abnormal levels of other serum enzymes: Secondary | ICD-10-CM | POA: Diagnosis not present

## 2020-10-23 DIAGNOSIS — M25561 Pain in right knee: Secondary | ICD-10-CM

## 2020-10-23 DIAGNOSIS — M67361 Transient synovitis, right knee: Secondary | ICD-10-CM

## 2020-10-23 MED ORDER — PREDNISONE 20 MG PO TABS: ORAL_TABLET | ORAL | 0 refills | Status: DC

## 2020-10-23 MED ORDER — METHYLPREDNISOLONE ACETATE 80 MG/ML IJ SUSP
80.0000 mg | Freq: Once | INTRAMUSCULAR | Status: AC
  Administered 2020-10-23: 80 mg via INTRAMUSCULAR

## 2020-10-23 NOTE — Progress Notes (Signed)
BP (!) 179/101   Pulse 82   Ht 5\' 9"  (1.753 m)   Wt 230 lb (104.3 kg)   SpO2 96%   BMI 33.97 kg/m    Subjective:   Patient ID: Michael Hays, male    DOB: [birth year], 50 y.o.   MRN: [id number]  HPI: Michael Hays is a 50 y.o. male presenting on 10/23/2020 for Knee Pain   HPI Patient is coming in today for right knee pain and swelling and inflammation and effusion.  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  He is going on vacation as well he wanted to see if he can get an injection and remove some of the fluid today.  Relevant past medical, surgical, family and social history reviewed and updated as indicated. Interim medical history since our last visit reviewed. Allergies and medications reviewed and updated.  Review of Systems  Constitutional:  Negative for chills and fever.  Eyes:  Negative for visual disturbance.  Respiratory:  Negative for shortness of breath and wheezing.   Cardiovascular:  Negative for chest pain and leg swelling.  Musculoskeletal:  Positive for arthralgias and joint swelling. Negative for back pain and gait problem.  Skin:  Negative for rash.  All other systems reviewed and are negative.  Per HPI unless specifically indicated above   Allergies as of 10/23/2020       Reactions   Atenolol    Codeine    Hydrocodone-acetaminophen         Medication List        Accurate as of October 23, 2020  4:52 PM. If you have any questions, ask your nurse or doctor.          amLODipine 5 MG tablet Commonly known as: NORVASC TAKE 1 TABLET BY MOUTH TWICE DAILY   colchicine 0.6 MG tablet TAKE 1 TABLET BY MOUTH DAILY   ibuprofen 200 MG tablet Commonly known as: ADVIL Take 200 mg by mouth every 6 (six) hours as needed.   losartan 50 MG tablet Commonly known as: COZAAR TAKE 1 TABLET BY MOUTH ONCE DAILY   meloxicam 15 MG tablet Commonly known as: MOBIC Take 15 mg by  mouth daily.   nadolol 40 MG tablet Commonly known as: CORGARD TAKE 1 TABLET BY MOUTH ONCE DAILY   predniSONE 20 MG tablet Commonly known as: DELTASONE 2 po at same time daily for 5 days Started by: Fransisca Kaufmann Camryn Quesinberry, MD   Repatha 140 MG/ML Sosy Generic drug: Evolocumab INJECT 1 SYRINGE INTO THE SKIN EVERY 14 DAYS         Objective:   BP (!) 179/101   Pulse 82   Ht 5\' 9"  (1.753 m)   Wt 230 lb (104.3 kg)   SpO2 96%   BMI 33.97 kg/m   Wt Readings from Last 3 Encounters:  10/23/20 230 lb (104.3 kg)  09/25/20 228 lb (103.4 kg)  04/17/20 228 lb (103.4 kg)    Physical Exam Vitals and nursing note reviewed.  Musculoskeletal:     Right knee: Effusion present. Tenderness present over the medial joint line and lateral joint line.    Knee injection and aspiration: Consent form signed. Risk factors of bleeding and infection discussed with patient and patient is agreeable towards injection.    Patient prepped with Betadine. Lateral approach towards injection used. Able to drain 10 cc of serosanguineous fluid. injected 80 mg  of Depo-Medrol and 1 mL of 2% lidocaine. Patient tolerated procedure well and no side effects from noted. Minimal to no bleeding. Simple bandage applied after.   Assessment & Plan:   Problem List Items Addressed This Visit       Musculoskeletal and Integument   Transient synovitis of right knee   Relevant Medications   predniSONE (DELTASONE) 20 MG tablet     Other   Chronic pain of right knee - Primary   Relevant Medications   predniSONE (DELTASONE) 20 MG tablet   Other Visit Diagnoses     Elevated liver enzymes       Relevant Orders   Hepatitis panel, acute       BP elevated because he does not like injections and is nervous about being here for an injection  Patient had elevated liver enzymes last time, will check blood work for that.  Follow up plan: Return if symptoms worsen or fail to improve.  Counseling provided for all of the  vaccine components Orders Placed This Encounter  Procedures   Hepatitis panel, acute    Caryl Pina, MD [facility] 10/23/2020, 4:52 PM

## 2020-10-24 ENCOUNTER — Other Ambulatory Visit: Payer: Self-pay | Admitting: Family Medicine

## 2020-10-24 ENCOUNTER — Other Ambulatory Visit (HOSPITAL_COMMUNITY): Payer: Self-pay

## 2020-10-24 DIAGNOSIS — I10 Essential (primary) hypertension: Secondary | ICD-10-CM

## 2020-10-24 LAB — BMP8+EGFR
BUN/Creatinine Ratio: 18 (ref 9–20)
BUN: 17 mg/dL (ref 6–24)
CO2: 21 mmol/L (ref 20–29)
Calcium: 9.9 mg/dL (ref 8.7–10.2)
Chloride: 103 mmol/L (ref 96–106)
Creatinine, Ser: 0.97 mg/dL (ref 0.76–1.27)
Glucose: 92 mg/dL (ref 65–99)
Potassium: 4.6 mmol/L (ref 3.5–5.2)
Sodium: 142 mmol/L (ref 134–144)
eGFR: 96 mL/min/1.73

## 2020-10-24 MED ORDER — NADOLOL 40 MG PO TABS
ORAL_TABLET | Freq: Every day | ORAL | 3 refills | Status: DC
  Filled 2020-10-24: qty 90, 90d supply, fill #0
  Filled 2021-01-29: qty 90, 90d supply, fill #1
  Filled 2021-04-29: qty 90, 90d supply, fill #2
  Filled 2021-07-27: qty 90, 90d supply, fill #3

## 2020-10-24 MED ORDER — LOSARTAN POTASSIUM 50 MG PO TABS
ORAL_TABLET | Freq: Every day | ORAL | 3 refills | Status: DC
  Filled 2020-10-24: qty 90, 90d supply, fill #0
  Filled 2021-01-29: qty 90, 90d supply, fill #1
  Filled 2021-04-29: qty 90, 90d supply, fill #2
  Filled 2021-07-27: qty 90, 90d supply, fill #3

## 2020-10-25 LAB — ACUTE VIRAL HEPATITIS (HAV, HBV, HCV)
HCV Ab: 0.2 s/co ratio (ref 0.0–0.9)
Hep A IgM: NEGATIVE
Hep B C IgM: NEGATIVE
Hepatitis B Surface Ag: NEGATIVE

## 2020-10-25 LAB — SPECIMEN STATUS REPORT

## 2020-10-25 LAB — HCV INTERPRETATION

## 2020-10-27 ENCOUNTER — Other Ambulatory Visit (HOSPITAL_COMMUNITY): Payer: Self-pay

## 2020-11-18 ENCOUNTER — Other Ambulatory Visit (HOSPITAL_COMMUNITY): Payer: Self-pay

## 2020-11-19 ENCOUNTER — Other Ambulatory Visit (HOSPITAL_COMMUNITY): Payer: Self-pay

## 2020-11-20 ENCOUNTER — Other Ambulatory Visit (HOSPITAL_COMMUNITY): Payer: Self-pay

## 2020-11-21 DIAGNOSIS — M67361 Transient synovitis, right knee: Secondary | ICD-10-CM | POA: Diagnosis not present

## 2020-12-16 ENCOUNTER — Other Ambulatory Visit (HOSPITAL_COMMUNITY): Payer: Self-pay

## 2020-12-17 ENCOUNTER — Other Ambulatory Visit (HOSPITAL_COMMUNITY): Payer: Self-pay

## 2020-12-22 ENCOUNTER — Other Ambulatory Visit (HOSPITAL_COMMUNITY): Payer: Self-pay

## 2021-01-20 ENCOUNTER — Other Ambulatory Visit (HOSPITAL_COMMUNITY): Payer: Self-pay

## 2021-01-21 ENCOUNTER — Other Ambulatory Visit (HOSPITAL_COMMUNITY): Payer: Self-pay

## 2021-01-29 ENCOUNTER — Other Ambulatory Visit: Payer: Self-pay | Admitting: Family Medicine

## 2021-01-29 DIAGNOSIS — I10 Essential (primary) hypertension: Secondary | ICD-10-CM

## 2021-01-30 ENCOUNTER — Other Ambulatory Visit (HOSPITAL_COMMUNITY): Payer: Self-pay

## 2021-01-30 MED ORDER — AMLODIPINE BESYLATE 5 MG PO TABS
ORAL_TABLET | Freq: Two times a day (BID) | ORAL | 0 refills | Status: DC
  Filled 2021-01-30: qty 180, 90d supply, fill #0

## 2021-02-19 ENCOUNTER — Ambulatory Visit: Payer: 59 | Admitting: Family Medicine

## 2021-02-19 ENCOUNTER — Encounter: Payer: Self-pay | Admitting: Family Medicine

## 2021-02-19 DIAGNOSIS — M10071 Idiopathic gout, right ankle and foot: Secondary | ICD-10-CM

## 2021-02-19 MED ORDER — PREDNISONE 20 MG PO TABS: ORAL_TABLET | ORAL | 0 refills | Status: DC

## 2021-02-19 MED ORDER — ALLOPURINOL 300 MG PO TABS: 300.0000 mg | ORAL_TABLET | Freq: Every day | ORAL | 6 refills | Status: DC

## 2021-02-19 NOTE — Progress Notes (Signed)
Virtual Visit via telephone Note  I connected with Michael Hays on 02/19/21 at 0808 by telephone and verified that I am speaking with the correct person using two identifiers. Michael Hays is currently located at home and patient are currently with her during visit. The provider, Fransisca Kaufmann Deanndra Kirley, MD is located in their office at time of visit.  Call ended at [phone]  I discussed the limitations, risks, security and privacy concerns of performing an evaluation and management service by telephone and the availability of in person appointments. I also discussed with the patient that there may be a patient responsible charge related to this service. The patient expressed understanding and agreed to proceed.   History and Present Illness: Patient is calling in for gout in his feet and ankles.  He is taking colchicine and it helps but it takes some time. He would like to have prednisone because it is stronger.  He is getting flares less frequently, every month or 2. He gets it in both feet and now it is mainly in right ankle.   1. Acute idiopathic gout of right foot     Outpatient Encounter Medications as of 02/19/2021  Medication Sig   allopurinol (ZYLOPRIM) 300 MG tablet Take 1 tablet (300 mg total) by mouth daily.   amLODipine (NORVASC) 5 MG tablet TAKE 1 TABLET BY MOUTH TWICE DAILY   colchicine 0.6 MG tablet TAKE 1 TABLET BY MOUTH DAILY   Evolocumab (REPATHA) 140 MG/ML SOSY INJECT 1 SYRINGE INTO THE SKIN EVERY 14 DAYS   ibuprofen (ADVIL) 200 MG tablet Take 200 mg by mouth every 6 (six) hours as needed.   losartan (COZAAR) 50 MG tablet TAKE 1 TABLET BY MOUTH ONCE DAILY   meloxicam (MOBIC) 15 MG tablet Take 15 mg by mouth daily.   nadolol (CORGARD) 40 MG tablet TAKE 1 TABLET BY MOUTH ONCE DAILY   predniSONE (DELTASONE) 20 MG tablet 2 po at same time daily for 5 days   [DISCONTINUED] predniSONE (DELTASONE) 20 MG tablet 2 po at same time daily for 5 days   No facility-administered  encounter medications on file as of 02/19/2021.    Review of Systems  Constitutional:  Negative for chills and fever.  Respiratory:  Negative for shortness of breath and wheezing.   Cardiovascular:  Negative for chest pain and leg swelling.  Musculoskeletal:  Positive for arthralgias and gait problem.  Skin:  Positive for color change. Negative for rash and wound.  All other systems reviewed and are negative.  Observations/Objective: Patient sounds comfortable   Assessment and Plan: Problem List Items Addressed This Visit       Other   Gout - Primary   Relevant Medications   predniSONE (DELTASONE) 20 MG tablet   allopurinol (ZYLOPRIM) 300 MG tablet    Will give prednisone to see if it helps with his current gout flare and then consider allopurinol in the future for prevention do not start it until he is well over this flare. Follow up plan: Return if symptoms worsen or fail to improve.     I discussed the assessment and treatment plan with the patient. The patient was provided an opportunity to ask questions and all were answered. The patient agreed with the plan and demonstrated an understanding of the instructions.   The patient was advised to call back or seek an in-person evaluation if the symptoms worsen or if the condition fails to improve as anticipated.  The above assessment and management plan  was discussed with the patient. The patient verbalized understanding of and has agreed to the management plan. Patient is aware to call the clinic if symptoms persist or worsen. Patient is aware when to return to the clinic for a follow-up visit. Patient educated on when it is appropriate to go to the emergency department.    I provided 6 minutes of non-face-to-face time during this encounter.    Worthy Rancher, MD

## 2021-02-24 ENCOUNTER — Ambulatory Visit: Payer: 59 | Admitting: Family

## 2021-02-24 ENCOUNTER — Encounter: Payer: Self-pay | Admitting: Family

## 2021-02-24 DIAGNOSIS — J209 Acute bronchitis, unspecified: Secondary | ICD-10-CM | POA: Diagnosis not present

## 2021-02-24 MED ORDER — BENZONATATE 200 MG PO CAPS: 200.0000 mg | ORAL_CAPSULE | Freq: Three times a day (TID) | ORAL | 1 refills | Status: DC | PRN

## 2021-02-24 MED ORDER — PREDNISONE 10 MG (21) PO TBPK
ORAL_TABLET | ORAL | 0 refills | Status: DC
Start: 2021-02-24 — End: 2021-05-13

## 2021-02-24 MED ORDER — FLUTICASONE PROPIONATE 50 MCG/ACT NA SUSP: 2.0000 | Freq: Every day | NASAL | 6 refills | Status: DC

## 2021-02-24 MED ORDER — CETIRIZINE HCL 10 MG PO TABS: 10.0000 mg | ORAL_TABLET | Freq: Every day | ORAL | 11 refills | Status: DC

## 2021-02-24 NOTE — Progress Notes (Signed)
Virtual Visit  Note Due to COVID-19 pandemic this visit was conducted virtually. This visit type was conducted due to national recommendations for restrictions regarding the COVID-19 Pandemic (e.g. social distancing, sheltering in place) in an effort to limit this patient's exposure and mitigate transmission in our community. All issues noted in this document were discussed and addressed.  A physical exam was not performed with this format.  I connected with Michael Hays on 02/24/21 at 12:39 pm by telephone and verified that I am speaking with the correct person using two identifiers. Michael Hays is currently located at work and no one is currently with him  during visit. The provider, Evelina Dun, FNP is located in their office at time of visit.  I discussed the limitations, risks, security and privacy concerns of performing an evaluation and management service by telephone and the availability of in person appointments. I also discussed with the patient that there may be a patient responsible charge related to this service. The patient expressed understanding and agreed to proceed.  [name], bosher are scheduled for a virtual visit with your provider today.    Just as we do with appointments in the office, we must obtain your consent to participate.  Your consent will be active for this visit and any virtual visit you may have with one of our providers in the next 365 days.    If you have a MyChart account, I can also send a copy of this consent to you electronically.  All virtual visits are billed to your insurance company just like a traditional visit in the office.  As this is a virtual visit, video technology does not allow for your provider to perform a traditional examination.  This may limit your provider's ability to fully assess your condition.  If your provider identifies any concerns that need to be evaluated in person or the need to arrange testing such as labs, EKG, etc, we will  make arrangements to do so.    Although advances in technology are sophisticated, we cannot ensure that it will always work on either your end or our end.  If the connection with a video visit is poor, we may have to switch to a telephone visit.  With either a video or telephone visit, we are not always able to ensure that we have a secure connection.   I need to obtain your verbal consent now.   Are you willing to proceed with your visit today?   Michael Hays has provided verbal consent on 02/24/2021 for a virtual visit (video or telephone).   Evelina Dun, [place] 02/24/2021  12:40 PM    History and Present Illness:  Pt calls the office today with cough and congestion.  Cough This is a new problem. The current episode started in the past 7 days. The problem has been gradually worsening. The problem occurs every few minutes. The cough is Productive of sputum. Associated symptoms include myalgias, nasal congestion and postnasal drip. Pertinent negatives include no chills, ear congestion, ear pain, fever, headaches, shortness of breath or wheezing. He has tried rest and OTC cough suppressant for the symptoms. The treatment provided mild relief.     Review of Systems  Constitutional:  Negative for chills and fever.  HENT:  Positive for postnasal drip. Negative for ear pain.   Respiratory:  Positive for cough. Negative for shortness of breath and wheezing.   Musculoskeletal:  Positive for myalgias.  Neurological:  Negative for headaches.  Observations/Objective: No SOB or distress noted, nasal congestion noted   Assessment and Plan: 1. Acute bronchitis, unspecified organism - Take meds as prescribed - Use a cool mist humidifier  -Use saline nose sprays frequently -Force fluids -For any cough or congestion  Use plain Mucinex- regular strength or max strength is fine -For fever or aces or pains- take tylenol or ibuprofen. -Throat lozenges if help RTO if symptoms worsen or do not  improve  - predniSONE (STERAPRED UNI-PAK 21 TAB) 10 MG (21) TBPK tablet; Use as directed  Dispense: 21 tablet; Refill: 0 - benzonatate (TESSALON) 200 MG capsule; Take 1 capsule (200 mg total) by mouth 3 (three) times daily as needed.  Dispense: 30 capsule; Refill: 1 - cetirizine (ZYRTEC) 10 MG tablet; Take 1 tablet (10 mg total) by mouth daily.  Dispense: 30 tablet; Refill: 11 - fluticasone (FLONASE) 50 MCG/ACT nasal spray; Place 2 sprays into both nostrils daily.  Dispense: 16 g; Refill: 6      I discussed the assessment and treatment plan with the patient. The patient was provided an opportunity to ask questions and all were answered. The patient agreed with the plan and demonstrated an understanding of the instructions.   The patient was advised to call back or seek an in-person evaluation if the symptoms worsen or if the condition fails to improve as anticipated.  The above assessment and management plan was discussed with the patient. The patient verbalized understanding of and has agreed to the management plan. Patient is aware to call the clinic if symptoms persist or worsen. Patient is aware when to return to the clinic for a follow-up visit. Patient educated on when it is appropriate to go to the emergency department.   Time call ended:  12:50 pm   I provided 11 minutes of  non face-to-face time during this encounter.    Evelina Dun, FNP

## 2021-03-02 ENCOUNTER — Telehealth: Payer: Self-pay | Admitting: Family Medicine

## 2021-03-02 MED ORDER — AZITHROMYCIN 250 MG PO TABS: ORAL_TABLET | ORAL | 0 refills | Status: DC

## 2021-03-02 NOTE — Telephone Encounter (Signed)
  Incoming Patient Call  03/02/2021  What symptoms do you have? Still coughing and thinks he has Bronchitis. Wants a Z pak  How long have you been sick? Week  Have you been seen for this problem? Yes. Had televisit with Alyse Low last week  If your provider decides to give you a prescription, which pharmacy would you like for it to be sent to? [facility] in [place]   Patient informed that this information will be sent to the clinical staff for review and that they should receive a follow up call.

## 2021-03-02 NOTE — Telephone Encounter (Signed)
Wife aware

## 2021-03-02 NOTE — Telephone Encounter (Signed)
Zpak Prescription sent to pharmacy   

## 2021-03-09 ENCOUNTER — Other Ambulatory Visit (HOSPITAL_COMMUNITY): Payer: Self-pay

## 2021-03-10 ENCOUNTER — Other Ambulatory Visit (HOSPITAL_COMMUNITY): Payer: Self-pay

## 2021-04-05 ENCOUNTER — Other Ambulatory Visit (HOSPITAL_COMMUNITY): Payer: Self-pay

## 2021-04-07 ENCOUNTER — Other Ambulatory Visit (HOSPITAL_COMMUNITY): Payer: Self-pay

## 2021-04-30 ENCOUNTER — Other Ambulatory Visit (HOSPITAL_COMMUNITY): Payer: Self-pay

## 2021-05-01 ENCOUNTER — Other Ambulatory Visit (HOSPITAL_COMMUNITY): Payer: Self-pay

## 2021-05-13 ENCOUNTER — Ambulatory Visit (INDEPENDENT_AMBULATORY_CARE_PROVIDER_SITE_OTHER): Payer: 59 | Admitting: Family Medicine

## 2021-05-13 ENCOUNTER — Encounter: Payer: Self-pay | Admitting: Family Medicine

## 2021-05-13 ENCOUNTER — Ambulatory Visit: Payer: 59 | Admitting: Family Medicine

## 2021-05-13 DIAGNOSIS — M10071 Idiopathic gout, right ankle and foot: Secondary | ICD-10-CM | POA: Diagnosis not present

## 2021-05-13 MED ORDER — DEXAMETHASONE 2 MG PO TABS: ORAL_TABLET | ORAL | 0 refills | Status: DC

## 2021-05-13 NOTE — Progress Notes (Signed)
Virtual Visit via telephone Note  I connected with Michael Hays on 05/13/21 at 1619 by telephone and verified that I am speaking with the correct person using two identifiers. Michael Hays is currently located at home and patient are currently with her during visit. The provider, Fransisca Kaufmann Shyvonne Chastang, MD is located in their office at time of visit.  Call ended at 1624  I discussed the limitations, risks, security and privacy concerns of performing an evaluation and management service by telephone and the availability of in person appointments. I also discussed with the patient that there may be a patient responsible charge related to this service. The patient expressed understanding and agreed to proceed.   History and Present Illness: Gout His knee is bothering him and swollen and started 2 days ago.  He was working outside.  The pain worsened to where he can't stand on it.  He can barely walk on it.  It is not red but is swollen.  He is taken ibuprofen and tylenol and they have helped only a little.  He has pain like he has had gout before.  He has taken colchicine.  He says this attack is likely gout.   1. Acute idiopathic gout of right foot     Outpatient Encounter Medications as of 05/13/2021  Medication Sig   dexamethasone (DECADRON) 2 MG tablet Take 4 tablets for 3 days then 3 tablets for 3 days then 2 tablets for 3 days then 1 tablet for 3 days   allopurinol (ZYLOPRIM) 300 MG tablet Take 1 tablet (300 mg total) by mouth daily.   amLODipine (NORVASC) 5 MG tablet TAKE 1 TABLET BY MOUTH TWICE DAILY   benzonatate (TESSALON) 200 MG capsule Take 1 capsule (200 mg total) by mouth 3 (three) times daily as needed.   cetirizine (ZYRTEC) 10 MG tablet Take 1 tablet (10 mg total) by mouth daily.   colchicine 0.6 MG tablet TAKE 1 TABLET BY MOUTH DAILY   Evolocumab (REPATHA) 140 MG/ML SOSY INJECT 1 SYRINGE INTO THE SKIN EVERY 14 DAYS   fluticasone (FLONASE) 50 MCG/ACT nasal spray Place 2 sprays  into both nostrils daily.   ibuprofen (ADVIL) 200 MG tablet Take 200 mg by mouth every 6 (six) hours as needed.   losartan (COZAAR) 50 MG tablet TAKE 1 TABLET BY MOUTH ONCE DAILY   meloxicam (MOBIC) 15 MG tablet Take 15 mg by mouth daily.   nadolol (CORGARD) 40 MG tablet TAKE 1 TABLET BY MOUTH ONCE DAILY   [DISCONTINUED] azithromycin (ZITHROMAX) 250 MG tablet Take 500 mg once, then 250 mg for four days   [DISCONTINUED] predniSONE (STERAPRED UNI-PAK 21 TAB) 10 MG (21) TBPK tablet Use as directed   No facility-administered encounter medications on file as of 05/13/2021.    Review of Systems  Constitutional:  Negative for chills and fever.  Respiratory:  Negative for shortness of breath and wheezing.   Cardiovascular:  Negative for chest pain and leg swelling.  Musculoskeletal:  Positive for arthralgias and joint swelling. Negative for back pain and gait problem.  Skin:  Negative for rash.  All other systems reviewed and are negative.  Observations/Objective: Patient sounds comfortable and in no acute distress  Assessment and Plan: Problem List Items Addressed This Visit       Other   Gout - Primary   Relevant Medications   dexamethasone (DECADRON) 2 MG tablet    We will treat like gout flare, is already tried anti-inflammatory such as colchicine and  ibuprofen over the past couple days and it continues to worsen, will do short course of steroids. Follow up plan: Return if symptoms worsen or fail to improve.     I discussed the assessment and treatment plan with the patient. The patient was provided an opportunity to ask questions and all were answered. The patient agreed with the plan and demonstrated an understanding of the instructions.   The patient was advised to call back or seek an in-person evaluation if the symptoms worsen or if the condition fails to improve as anticipated.  The above assessment and management plan was discussed with the patient. The patient verbalized  understanding of and has agreed to the management plan. Patient is aware to call the clinic if symptoms persist or worsen. Patient is aware when to return to the clinic for a follow-up visit. Patient educated on when it is appropriate to go to the emergency department.    I provided 5 minutes of non-face-to-face time during this encounter.    Worthy Rancher, MD

## 2021-06-03 ENCOUNTER — Other Ambulatory Visit: Payer: Self-pay | Admitting: Family Medicine

## 2021-06-03 DIAGNOSIS — Z9189 Other specified personal risk factors, not elsewhere classified: Secondary | ICD-10-CM

## 2021-06-03 DIAGNOSIS — E782 Mixed hyperlipidemia: Secondary | ICD-10-CM

## 2021-06-04 ENCOUNTER — Other Ambulatory Visit (HOSPITAL_COMMUNITY): Payer: Self-pay

## 2021-06-04 MED ORDER — REPATHA 140 MG/ML ~~LOC~~ SOSY
PREFILLED_SYRINGE | SUBCUTANEOUS | 1 refills | Status: DC
  Filled 2021-06-04: qty 6, 84d supply, fill #0
  Filled 2021-09-24: qty 6, 84d supply, fill #1
  Filled 2021-12-21: qty 2, 28d supply, fill #2

## 2021-06-05 ENCOUNTER — Other Ambulatory Visit (HOSPITAL_COMMUNITY): Payer: Self-pay

## 2021-06-11 IMAGING — MR MR KNEE*R* W/O CM
7 series · 40 of 40 positions shown · non-contrast
Comparison: X-ray 04/17/2020

CLINICAL DATA: Right knee pain for 5 years

EXAM:
MRI OF THE RIGHT KNEE WITHOUT CONTRAST
TECHNIQUE: Multiplanar, multisequence MR imaging of the knee was performed. No
intravenous contrast was administered.

[Series 8: T2 fat-sat · axial · right · 4.0mm · 0.47mm/px · z∈[-83,+42]mm · 5 of 26 slices shown (1 of 3)]
[im 1/26]
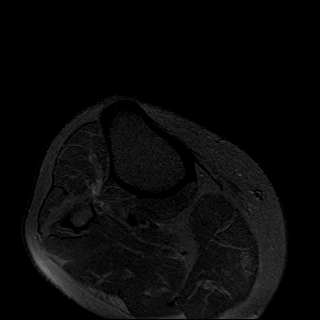
[im 7/26]
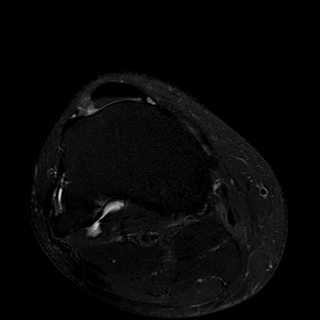
[im 13/26]
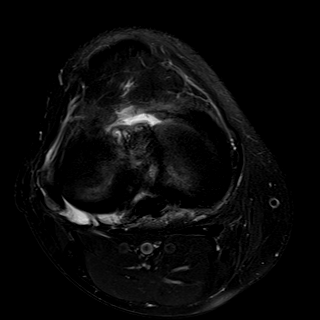
[im 19/26]
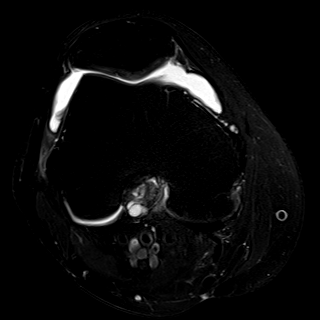
[im 26/26]
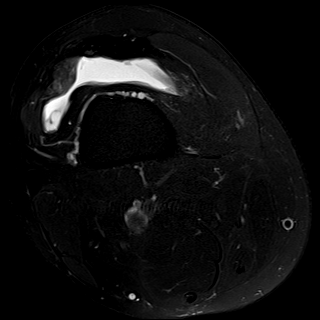

[Series 9: T1 · coronal · right · 4.0mm · 0.59mm/px · 6 of 29 slices shown]
[im 1/29]
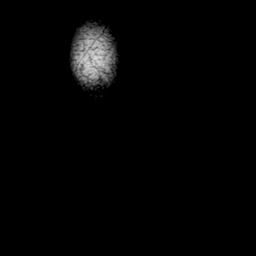
[im 6/29]
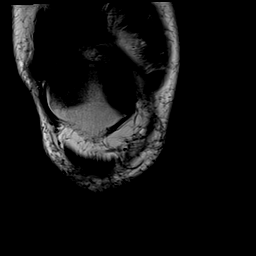
[im 12/29]
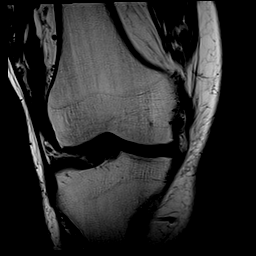
[im 17/29]
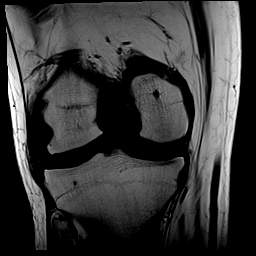
[im 23/29]
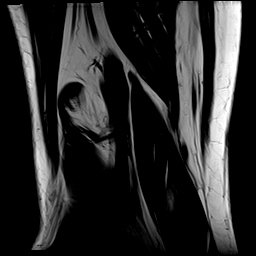
[im 29/29]
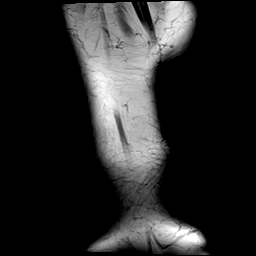

[Series 10: T2 fat-sat · coronal · right · 4.0mm · 0.59mm/px · 6 of 30 slices shown (2 of 3)]
[im 1/30]
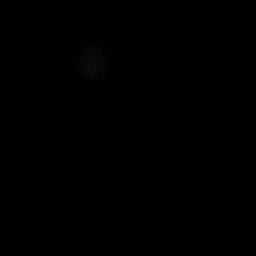
[im 6/30]
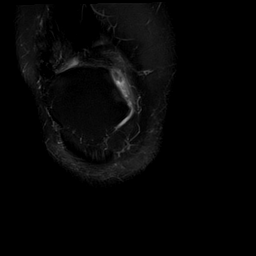
[im 12/30]
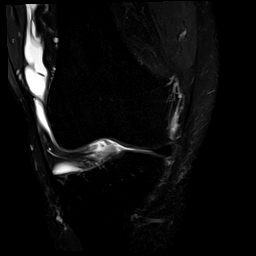
[im 18/30]
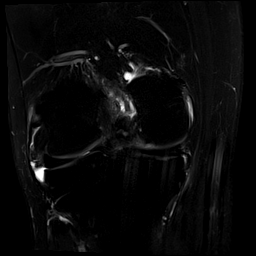
[im 24/30]
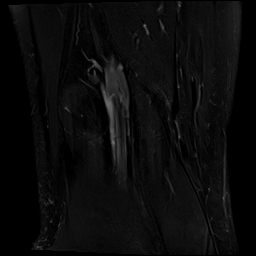
[im 30/30]
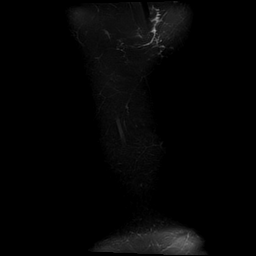

[Series 11: PD fat-sat · coronal · right · 3.0mm · 0.59mm/px · 7 of 37 slices shown (1 of 2)]
[im 1/37]
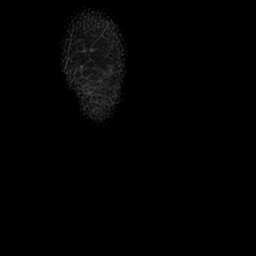
[im 7/37]
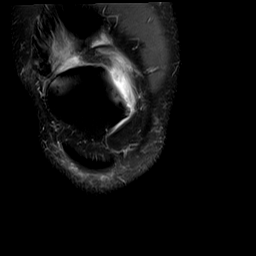
[im 13/37]
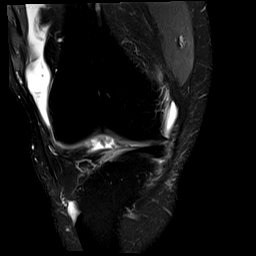
[im 19/37]
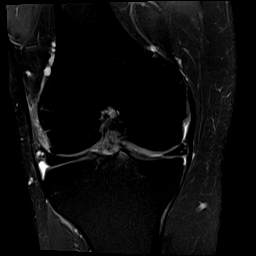
[im 25/37]
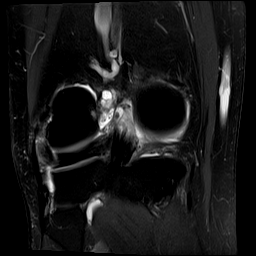
[im 31/37]
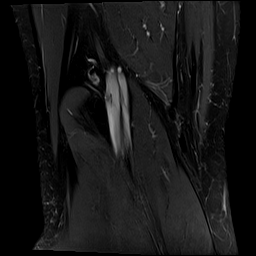
[im 37/37]
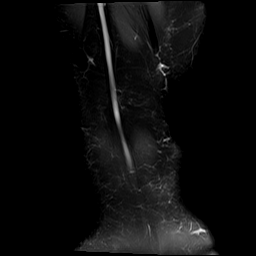

[Series 12: PD fat-sat · sagittal · right · 3.0mm · 0.66mm/px · 6 of 33 slices shown (2 of 2)]
[im 1/33]
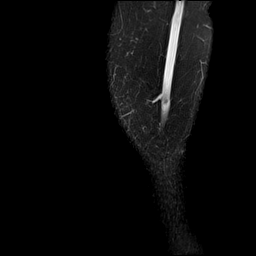
[im 7/33]
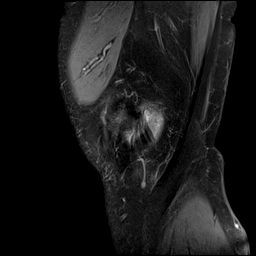
[im 13/33]
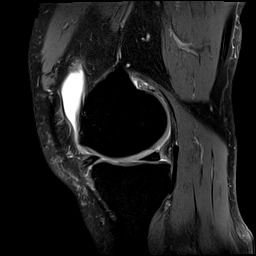
[im 20/33]
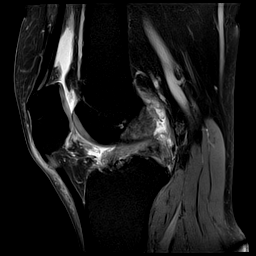
[im 26/33]
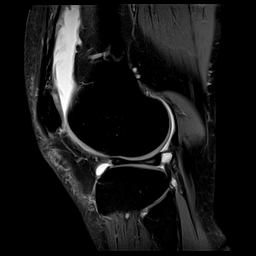
[im 33/33]
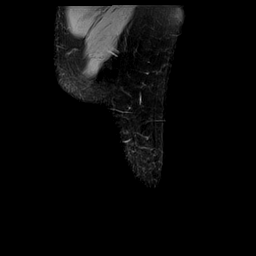

[Series 13: T2 fat-sat · sagittal · right · 3.0mm · 0.66mm/px · 6 of 33 slices shown (3 of 3)]
[im 1/33]
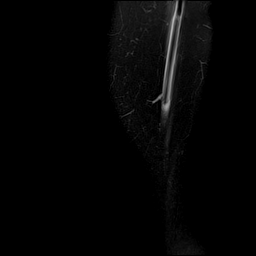
[im 7/33]
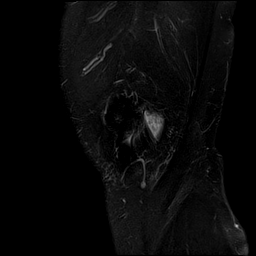
[im 13/33]
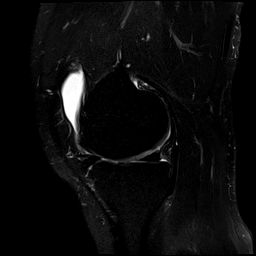
[im 20/33]
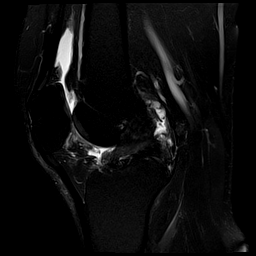
[im 26/33]
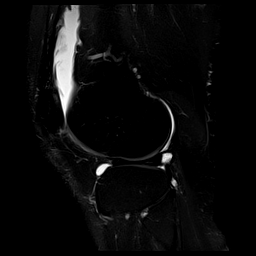
[im 33/33]
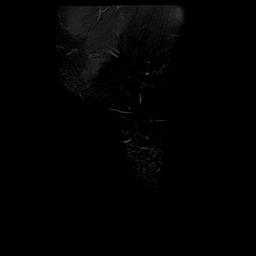

[Series 14: PD · coronal · right · 2.0mm · 0.47mm/px · 4 of 20 slices shown]
[im 1/20]
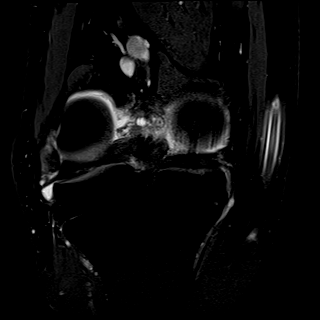
[im 7/20]
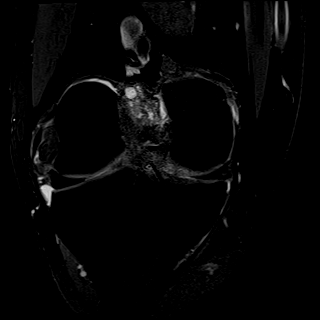
[im 13/20]
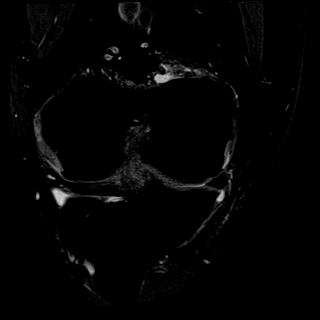
[im 20/20]
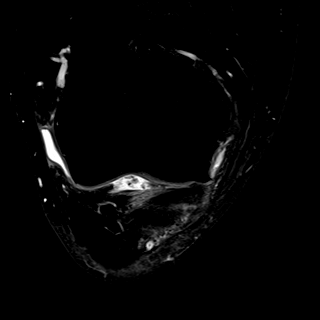

[40 of 40 positions shown; findings below may reference images not displayed]

FINDINGS: MENISCI

Medial meniscus: Intrasubstance degeneration of the medial meniscus.
Marked heterogeneity of the anterior horn at the level of the
anterior root attachment with suspected developing
intrameniscal/parameniscal cyst formation (series 12, image 18).

Lateral meniscus: Intrasubstance degeneration with irregular oblique
tear involving the undersurface of the posterior horn (series 12,
image 10). Additional oblique tear extending into the anterior root
attachment (series 12, images 12-14).

LIGAMENTS

Cruciates: Marked thickening and heterogeneity of the anterior
cruciate ligament without complete disruption, suggesting advanced
mucoid degeneration. Posterior cruciate ligaments are also thickened
and heterogeneous in signal intensity without disruption.

Collaterals: Thickened appearance of the MCL without focal tear or
periligamentous edema, suggesting sequela of remote trauma.
Intermediate signal within the femoral attachment of the fibular
collateral ligament with mild adjacent edema. Lateral collateral
ligament complex otherwise intact.

CARTILAGE

Patellofemoral: Mild partial-thickness chondral surface irregularity
without focal defect.

Medial: Chondral thinning and surface irregularity the medial
femoral condyle without full-thickness defect.

Lateral:  No chondral defect.

Joint: Moderate sized knee joint effusion. Mild edema within Hoffa's
fat.

Popliteal Fossa: No Baker's cyst. Marked tendinosis of the distal
popliteus tendon.

Extensor Mechanism: Mass-like area of soft tissue nodularity arising
from the lateral patellofemoral ligament/retinaculum located just
above the superior margin of the patella measuring 3.4 x 1.6 x
cm (series 13, image 5; series 8, image 3). This lesion is T1
hypointense and T2 heterogeneously hyperintense. Intact quadriceps
tendon and patellar tendon. Mild tendinosis of the distal quadriceps
and proximal patellar tendons.

Bones: No focal marrow signal abnormality. No fracture or
dislocation.

Other: Multilobulated cystic structure the posterior aspect of the
intercondylar notch measuring up to 3.5 cm likely reflecting
ganglion cysts.
IMPRESSION: 1. Mass-like area of soft tissue nodularity arising from the lateral
patellofemoral ligament/retinaculum located just above the superior
margin of the patella measuring 3.4 x 1.6 x 2.5 cm. Differential
includes focal nodular synovitis versus primary soft tissue
neoplasm/sarcoma. Orthopedic evaluation and tissue sampling are
recommended.
2. Degeneration and tearing of the medial and lateral menisci, as
described above.
3. Moderate sized knee joint effusion.
4. Marked thickening and heterogeneity of the anterior cruciate
ligament, suggesting advanced mucoid degeneration.
5. Low-grade sprain of the fibular collateral ligament.
6. Marked tendinosis of the distal popliteus tendon.
7. Mild tendinosis of the distal quadriceps and proximal patellar
tendons.

These results will be called to the ordering clinician or
representative by the Radiologist Assistant, and communication
documented in the PACS or [REDACTED].

## 2021-06-29 ENCOUNTER — Other Ambulatory Visit: Payer: Self-pay | Admitting: Family Medicine

## 2021-06-30 ENCOUNTER — Other Ambulatory Visit (HOSPITAL_COMMUNITY): Payer: Self-pay

## 2021-06-30 MED ORDER — COLCHICINE 0.6 MG PO TABS
ORAL_TABLET | Freq: Every day | ORAL | 2 refills | Status: DC
  Filled 2021-06-30: qty 90, 90d supply, fill #0

## 2021-07-14 ENCOUNTER — Telehealth: Payer: Self-pay | Admitting: *Deleted

## 2021-07-14 DIAGNOSIS — R748 Abnormal levels of other serum enzymes: Secondary | ICD-10-CM

## 2021-07-14 DIAGNOSIS — E782 Mixed hyperlipidemia: Secondary | ICD-10-CM

## 2021-07-14 NOTE — Telephone Encounter (Signed)
Michael Hays (Key: [id number]) ?Repatha '140MG'$ /ML syringes ?PA started  ? ? ?Sent to plan  ? ?Wait for Determination ?Please wait for MedImpact 2017 to return a determination. ?

## 2021-07-15 NOTE — Telephone Encounter (Signed)
The request has been approved. The authorization is effective for a maximum of 12 fills from 07/14/2021 to 07/14/2022, as long as the member is enrolled in their current health plan. The request was approved as submitted. A written notification letter will follow with additional details. ? ? ?Pharmacy aware, ?

## 2021-07-28 ENCOUNTER — Other Ambulatory Visit (HOSPITAL_COMMUNITY): Payer: Self-pay

## 2021-09-25 ENCOUNTER — Other Ambulatory Visit (HOSPITAL_COMMUNITY): Payer: Self-pay

## 2021-09-25 ENCOUNTER — Encounter: Payer: Self-pay | Admitting: Family Medicine

## 2021-09-25 ENCOUNTER — Ambulatory Visit (INDEPENDENT_AMBULATORY_CARE_PROVIDER_SITE_OTHER): Payer: 59

## 2021-09-25 ENCOUNTER — Ambulatory Visit: Payer: 59 | Admitting: Family Medicine

## 2021-09-25 VITALS — BP 156/91 | HR 92 | Temp 98.3°F | Ht 69.0 in | Wt 233.0 lb

## 2021-09-25 DIAGNOSIS — M25561 Pain in right knee: Secondary | ICD-10-CM | POA: Diagnosis not present

## 2021-09-25 DIAGNOSIS — M25461 Effusion, right knee: Secondary | ICD-10-CM

## 2021-09-25 MED ORDER — METHYLPREDNISOLONE ACETATE 80 MG/ML IJ SUSP: 80.0000 mg | Freq: Once | INTRAMUSCULAR | Status: DC

## 2021-09-25 MED ORDER — METHYLPREDNISOLONE ACETATE 40 MG/ML IJ SUSP
40.0000 mg | Freq: Once | INTRAMUSCULAR | Status: AC
  Administered 2021-09-25: 80 mg via INTRAMUSCULAR

## 2021-09-25 MED ORDER — METHYLPREDNISOLONE ACETATE 40 MG/ML IJ SUSP: 80.0000 mg | Freq: Once | INTRAMUSCULAR | Status: DC

## 2021-09-25 MED ORDER — PREDNISONE 20 MG PO TABS: ORAL_TABLET | ORAL | 0 refills | Status: DC

## 2021-09-25 NOTE — Progress Notes (Signed)
BP (!) 156/91   Pulse 92   Temp 98.3 F (36.8 C)   Ht 5\' 9"  (1.753 m)   Wt 233 lb (105.7 kg)   SpO2 99%   BMI 34.41 kg/m    Subjective:   Patient ID: [name], male    DOB: 1970-08-01, 51 y.o.   MRN: [id number]  HPI: ROLF STANGLAND is a 51 y.o. male presenting on 09/25/2021 for Knee Pain (Right- heard pop 2 days ago when changing socks)   HPI Patient is coming in today with right knee pain.  He says he was at work a few days ago and he twisted and he felt and heard a pop in his knee and since then he has been having more swelling in his knee and more pain that hurts more on the medial aspect of the knee.  He denies any numbness or weakness.  He does feel like he has some giving way and a lot of swelling and he has so much pain and swelling noted that he cannot straighten all the way and it hurts him to walk.  Relevant past medical, surgical, family and social history reviewed and updated as indicated. Interim medical history since our last visit reviewed. Allergies and medications reviewed and updated.  Review of Systems  Constitutional:  Negative for chills and fever.  Respiratory:  Negative for shortness of breath and wheezing.   Cardiovascular:  Negative for chest pain and leg swelling.  Musculoskeletal:  Positive for arthralgias and gait problem. Negative for back pain.  Skin:  Negative for rash.  All other systems reviewed and are negative.   Per HPI unless specifically indicated above   Allergies as of 09/25/2021       Reactions   Atenolol    Codeine    Hydrocodone-acetaminophen         Medication List        Accurate as of September 25, 2021  4:21 PM. If you have any questions, ask your nurse or doctor.          STOP taking these medications    allopurinol 300 MG tablet Commonly known as: ZYLOPRIM Stopped by: [name] Audie Wieser, MD   benzonatate 200 MG capsule Commonly known as: TESSALON Stopped by: Nils Pyle, MD   cetirizine 10 MG  tablet Commonly known as: ZYRTEC Stopped by: Nils Pyle, MD   dexamethasone 2 MG tablet Commonly known as: DECADRON Stopped by: Nils Pyle, MD   fluticasone 50 MCG/ACT nasal spray Commonly known as: FLONASE Stopped by: Nils Pyle, MD       TAKE these medications    amLODipine 5 MG tablet Commonly known as: NORVASC TAKE 1 TABLET BY MOUTH TWICE DAILY   colchicine 0.6 MG tablet TAKE 1 TABLET BY MOUTH DAILY   ibuprofen 200 MG tablet Commonly known as: ADVIL Take 200 mg by mouth every 6 (six) hours as needed.   losartan 50 MG tablet Commonly known as: COZAAR TAKE 1 TABLET BY MOUTH ONCE DAILY   meloxicam 15 MG tablet Commonly known as: MOBIC Take 15 mg by mouth daily.   nadolol 40 MG tablet Commonly known as: CORGARD TAKE 1 TABLET BY MOUTH ONCE DAILY   naproxen sodium 220 MG tablet Commonly known as: ALEVE Take 220 mg by mouth.   predniSONE 20 MG tablet Commonly known as: DELTASONE 2 po at same time daily for 5 days Started by: Nils Pyle, MD   Repatha 140 MG/ML Sosy  Generic drug: Evolocumab INJECT 1 SYRINGE INTO THE SKIN EVERY 14 DAYS         Objective:   BP (!) 156/91   Pulse 92   Temp 98.3 F (36.8 C)   Ht 5\' 9"  (1.753 m)   Wt 233 lb (105.7 kg)   SpO2 99%   BMI 34.41 kg/m   Wt Readings from Last 3 Encounters:  09/25/21 233 lb (105.7 kg)  10/23/20 230 lb (104.3 kg)  09/25/20 228 lb (103.4 kg)    Physical Exam Vitals and nursing note reviewed.  Constitutional:      General: He is not in acute distress.    Appearance: He is well-developed. He is not diaphoretic.  Eyes:     General: No scleral icterus.    Conjunctiva/sclera: Conjunctivae normal.  Neck:     Thyroid: No thyromegaly.  Cardiovascular:     Rate and Rhythm: Normal rate and regular rhythm.     Heart sounds: Normal heart sounds. No murmur heard. Pulmonary:     Effort: Pulmonary effort is normal. No respiratory distress.     Breath sounds: Normal  breath sounds. No wheezing.  Musculoskeletal:        General: Normal range of motion.     Cervical back: Neck supple.  Lymphadenopathy:     Cervical: No cervical adenopathy.  Skin:    General: Skin is warm and dry.     Findings: No rash.  Neurological:     Mental Status: He is alert and oriented to person, place, and time.     Coordination: Coordination normal.  Psychiatric:        Behavior: Behavior normal.     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  Recommended some steroids and given injection.  If not improved then go see orthopedics again. Follow up plan: Return if symptoms worsen or fail to improve.  Counseling provided for all of the vaccine components Orders Placed This Encounter  Procedures   DG Knee 1-2 Views Right    Arville Care, MD [facility] 09/25/2021, 4:21 PM

## 2021-09-28 ENCOUNTER — Other Ambulatory Visit (HOSPITAL_COMMUNITY): Payer: Self-pay

## 2021-10-07 ENCOUNTER — Other Ambulatory Visit (HOSPITAL_COMMUNITY): Payer: Self-pay

## 2021-10-08 ENCOUNTER — Other Ambulatory Visit (HOSPITAL_COMMUNITY): Payer: Self-pay

## 2021-10-09 ENCOUNTER — Other Ambulatory Visit (HOSPITAL_COMMUNITY): Payer: Self-pay

## 2021-10-09 ENCOUNTER — Ambulatory Visit (INDEPENDENT_AMBULATORY_CARE_PROVIDER_SITE_OTHER): Payer: 59 | Admitting: Physician Assistant

## 2021-10-09 ENCOUNTER — Encounter: Payer: Self-pay | Admitting: Physician Assistant

## 2021-10-09 VITALS — BP 150/85 | HR 80 | Temp 98.2°F | Ht 69.0 in | Wt 231.0 lb

## 2021-10-09 DIAGNOSIS — R748 Abnormal levels of other serum enzymes: Secondary | ICD-10-CM | POA: Diagnosis not present

## 2021-10-09 DIAGNOSIS — Z0001 Encounter for general adult medical examination with abnormal findings: Secondary | ICD-10-CM | POA: Diagnosis not present

## 2021-10-09 DIAGNOSIS — I10 Essential (primary) hypertension: Secondary | ICD-10-CM

## 2021-10-09 DIAGNOSIS — E782 Mixed hyperlipidemia: Secondary | ICD-10-CM

## 2021-10-09 DIAGNOSIS — Z0000 Encounter for general adult medical examination without abnormal findings: Secondary | ICD-10-CM

## 2021-10-09 MED ORDER — LOSARTAN POTASSIUM 50 MG PO TABS
ORAL_TABLET | Freq: Every day | ORAL | 3 refills | Status: DC
  Filled 2021-10-09: qty 90, 90d supply, fill #0
  Filled 2022-01-05: qty 90, 90d supply, fill #1
  Filled 2022-06-28: qty 90, 90d supply, fill #2

## 2021-10-09 MED ORDER — NADOLOL 40 MG PO TABS
ORAL_TABLET | Freq: Every day | ORAL | 3 refills | Status: DC
  Filled 2021-10-09: qty 90, 90d supply, fill #0
  Filled 2022-01-05: qty 90, 90d supply, fill #1
  Filled 2022-04-19: qty 90, 90d supply, fill #2
  Filled 2022-07-25: qty 90, 90d supply, fill #3

## 2021-10-09 NOTE — Progress Notes (Signed)
  Subjective:     Patient ID: Michael Hays, male   DOB: 15-Sep-1970, 51 y.o.   MRN: [id number]  HPI Pt here for yearly exam States he is doing well PMH/PSH reviewed with pt today States currently taking meds as prescribed Currently active in construction trade  Review of Systems  Constitutional: Negative.   HENT: Negative.    Eyes: Negative.   Cardiovascular: Negative.   Gastrointestinal: Negative.   Endocrine: Negative.   Genitourinary: Negative.   Musculoskeletal: Negative.   Skin: Negative.   Allergic/Immunologic: Negative.   Neurological: Negative.   Hematological: Negative.   Psychiatric/Behavioral: Negative.         Objective:   Physical Exam Vitals and nursing note reviewed.  Constitutional:      General: He is not in acute distress.    Appearance: Normal appearance. He is obese. He is not ill-appearing or toxic-appearing.  HENT:     Head: Normocephalic and atraumatic.     Right Ear: Tympanic membrane, ear canal and external ear normal.     Left Ear: Tympanic membrane, ear canal and external ear normal.     Mouth/Throat:     Mouth: Mucous membranes are moist.     Pharynx: Oropharynx is clear.  Eyes:     General:        Right eye: Discharge present.     Extraocular Movements: Extraocular movements intact.     Pupils: Pupils are equal, round, and reactive to light.  Neck:     Vascular: No carotid bruit.  Cardiovascular:     Rate and Rhythm: Normal rate and regular rhythm.     Pulses: Normal pulses.     Heart sounds: Normal heart sounds.  Pulmonary:     Effort: Pulmonary effort is normal.     Breath sounds: Normal breath sounds.  Abdominal:     General: There is no distension.     Palpations: Abdomen is soft. There is no mass.     Tenderness: There is no abdominal tenderness. There is no right CVA tenderness, left CVA tenderness, guarding or rebound.     Hernia: No hernia is present.  Musculoskeletal:     Cervical back: Normal range of motion and neck  supple. No rigidity or tenderness.  Lymphadenopathy:     Cervical: No cervical adenopathy.  Neurological:     Mental Status: He is alert.        Assessment:     1. Preventative health care        Plan:     Pt has baseline EKG and asymp so not repeated today Pt is non fasting so future order for labs done Cologuard ordered today Recommended eye exam God diet/exercise reviewed Continue all current meds Would like a log of BP readings from home F/U pending labs PATP

## 2021-10-09 NOTE — Patient Instructions (Signed)
Gout  Gout is a condition that causes painful swelling of the joints. Gout is a type of inflammation of the joints (arthritis). This condition is caused by having too much uric acid in the body. Uric acid is a chemical that forms when the body breaks down substances called purines. Purines are important for building body proteins. When the body has too much uric acid, sharp crystals can form and build up inside the joints. This causes pain and swelling. Gout attacks can happen quickly and may be very painful (acute gout). Over time, the attacks can affect more joints and become more frequent (chronic gout). Gout can also cause uric acid to build up under the skin and inside the kidneys. What are the causes? This condition is caused by too much uric acid in your blood. This can happen because: Your kidneys do not remove enough uric acid from your blood. This is the most common cause. Your body makes too much uric acid. This can happen with some cancers and cancer treatments. It can also occur if your body is breaking down too many red blood cells (hemolytic anemia). You eat too many foods that are high in purines. These foods include organ meats and some seafood. Alcohol, especially beer, is also high in purines. A gout attack may be triggered by trauma or stress. What increases the risk? The following factors may make you more likely to develop this condition: Having a family history of gout. Being male and middle-aged. Being male and having gone through menopause. Taking certain medicines, including aspirin, cyclosporine, diuretics, levodopa, and niacin. Having an organ transplant. Having certain conditions, such as: Being obese. Lead poisoning. Kidney disease. A skin condition called psoriasis. Other factors include: Losing weight too quickly. Being dehydrated. Frequently drinking alcohol, especially beer. Frequently drinking beverages that are sweetened with a type of sugar called  fructose. What are the signs or symptoms? An attack of acute gout happens quickly. It usually occurs in just one joint. The most common place is the big toe. Attacks often start at night. Other joints that may be affected include joints of the feet, ankle, knee, fingers, wrist, or elbow. Symptoms of this condition may include: Severe pain. Warmth. Swelling. Stiffness. Tenderness. The affected joint may be very painful to touch. Shiny, red, or purple skin. Chills and fever. Chronic gout may cause symptoms more frequently. More joints may be involved. You may also have white or yellow lumps (tophi) on your hands or feet or in other areas near your joints. How is this diagnosed? This condition is diagnosed based on your symptoms, your medical history, and a physical exam. You may have tests, such as: Blood tests to measure uric acid levels. Removal of joint fluid with a thin needle (aspiration) to look for uric acid crystals. X-rays to look for joint damage. How is this treated? Treatment for this condition has two phases: treating an acute attack and preventing future attacks. Acute gout treatment may include medicines to reduce pain and swelling, including: NSAIDs, such as ibuprofen. Steroids. These are strong anti-inflammatory medicines that can be taken by mouth (orally) or injected into a joint. Colchicine. This medicine relieves pain and swelling when it is taken soon after an attack. It can be given by mouth or through an IV. Preventive treatment may include: Daily use of smaller doses of NSAIDs or colchicine. Use of a medicine that reduces uric acid levels in your blood, such as allopurinol. Changes to your diet. You may need to see   a dietitian about what to eat and drink to prevent gout. Follow these instructions at home: During a gout attack  If directed, put ice on the affected area. To do this: Put ice in a plastic bag. Place a towel between your skin and the bag. Leave the  ice on for 20 minutes, 2-3 times a day. Remove the ice if your skin turns bright red. This is very important. If you cannot feel pain, heat, or cold, you have a greater risk of damage to the area. Raise (elevate) the affected joint above the level of your heart as often as possible. Rest the joint as much as possible. If the affected joint is in your leg, you may be given crutches to use. Follow instructions from your health care provider about eating or drinking restrictions. Avoiding future gout attacks Follow a low-purine diet as told by your dietitian or health care provider. Avoid foods and drinks that are high in purines, including liver, kidney, anchovies, asparagus, herring, mushrooms, mussels, and beer. Maintain a healthy weight or lose weight if you are overweight. If you want to lose weight, talk with your health care provider. Do not lose weight too quickly. Start or maintain an exercise program as told by your health care provider. Eating and drinking Avoid drinking beverages that contain fructose. Drink enough fluids to keep your urine pale yellow. If you drink alcohol: Limit how much you have to: 0-1 drink a day for women who are not pregnant. 0-2 drinks a day for men. Know how much alcohol is in a drink. In the [place] one drink equals one 12 oz bottle of beer (355 mL), one 5 oz glass of wine (148 mL), or one 1 oz glass of hard liquor (44 mL). General instructions Take over-the-counter and prescription medicines only as told by your health care provider. Ask your health care provider if the medicine prescribed to you requires you to avoid driving or using machinery. Return to your normal activities as told by your health care provider. Ask your health care provider what activities are safe for you. Keep all follow-up visits. This is important. Where to find more information National Institutes of Health: www.niams.nih.gov Contact a health care provider if you have: Another  gout attack. Continuing symptoms of a gout attack after 10 days of treatment. Side effects from your medicines. Chills or a fever. Burning pain when you urinate. Pain in your lower back or abdomen. Get help right away if you: Have severe or uncontrolled pain. Cannot urinate. Summary Gout is painful swelling of the joints caused by having too much uric acid in the body. The most common site for gout to occur is in the big toe, but it can affect other joints in the body. Medicines and dietary changes can help to prevent and treat gout attacks. This information is not intended to replace advice given to you by your health care provider. Make sure you discuss any questions you have with your health care provider. Document Revised: 12/24/2020 Document Reviewed: 12/24/2020 Elsevier Patient Education  2023 Elsevier Inc.  

## 2021-10-13 ENCOUNTER — Other Ambulatory Visit: Payer: 59

## 2021-10-13 DIAGNOSIS — Z0000 Encounter for general adult medical examination without abnormal findings: Secondary | ICD-10-CM | POA: Diagnosis not present

## 2021-10-13 DIAGNOSIS — E782 Mixed hyperlipidemia: Secondary | ICD-10-CM

## 2021-10-13 DIAGNOSIS — R748 Abnormal levels of other serum enzymes: Secondary | ICD-10-CM

## 2021-10-13 DIAGNOSIS — I10 Essential (primary) hypertension: Secondary | ICD-10-CM

## 2021-10-14 LAB — CMP14+EGFR
ALT: 39 IU/L (ref 0–44)
AST: 29 IU/L (ref 0–40)
Albumin/Globulin Ratio: 1.6 (ref 1.2–2.2)
Albumin: 4.5 g/dL (ref 4.1–5.1)
Alkaline Phosphatase: 84 IU/L (ref 44–121)
BUN/Creatinine Ratio: 21 — ABNORMAL HIGH (ref 9–20)
BUN: 18 mg/dL (ref 6–24)
Bilirubin Total: 0.5 mg/dL (ref 0.0–1.2)
CO2: 21 mmol/L (ref 20–29)
Calcium: 9.8 mg/dL (ref 8.7–10.2)
Chloride: 100 mmol/L (ref 96–106)
Creatinine, Ser: 0.84 mg/dL (ref 0.76–1.27)
Globulin, Total: 2.8 g/dL (ref 1.5–4.5)
Glucose: 106 mg/dL — ABNORMAL HIGH (ref 70–99)
Potassium: 4.6 mmol/L (ref 3.5–5.2)
Sodium: 137 mmol/L (ref 134–144)
Total Protein: 7.3 g/dL (ref 6.0–8.5)
eGFR: 106 mL/min/{1.73_m2} (ref >59)

## 2021-10-14 LAB — LIPID PANEL
Chol/HDL Ratio: 4.8 ratio (ref 0.0–5.0)
Cholesterol, Total: 300 mg/dL — ABNORMAL HIGH (ref 100–199)
HDL: 63 mg/dL (ref >39)
LDL Chol Calc (NIH): 209 mg/dL — ABNORMAL HIGH (ref 0–99)
Triglycerides: 153 mg/dL — ABNORMAL HIGH (ref 0–149)
VLDL Cholesterol Cal: 28 mg/dL (ref 5–40)

## 2021-10-14 LAB — PSA, TOTAL AND FREE
PSA, Free Pct: 29.6 %
PSA, Free: 0.68 ng/mL
Prostate Specific Ag, Serum: 2.3 ng/mL (ref 0.0–4.0)

## 2021-10-14 LAB — HEPATIC FUNCTION PANEL: Bilirubin, Direct: 0.14 mg/dL (ref 0.00–0.40)

## 2021-10-20 ENCOUNTER — Telehealth: Payer: Self-pay | Admitting: *Deleted

## 2021-10-20 NOTE — Telephone Encounter (Signed)
Michael Hays ordered labs at appt last week - can y ou go in and address the labs for this pt ?   You can let pt or [name] know

## 2021-11-02 DIAGNOSIS — M25561 Pain in right knee: Secondary | ICD-10-CM | POA: Diagnosis not present

## 2021-12-22 ENCOUNTER — Other Ambulatory Visit (HOSPITAL_COMMUNITY): Payer: Self-pay

## 2021-12-22 ENCOUNTER — Other Ambulatory Visit: Payer: Self-pay | Admitting: Family Medicine

## 2021-12-22 DIAGNOSIS — E782 Mixed hyperlipidemia: Secondary | ICD-10-CM

## 2021-12-22 DIAGNOSIS — Z9189 Other specified personal risk factors, not elsewhere classified: Secondary | ICD-10-CM

## 2021-12-22 MED ORDER — REPATHA 140 MG/ML ~~LOC~~ SOSY
1.0000 | PREFILLED_SYRINGE | SUBCUTANEOUS | 1 refills | Status: DC
  Filled 2021-12-22: qty 6, 84d supply, fill #0
  Filled 2022-04-15: qty 6, 84d supply, fill #1

## 2021-12-23 ENCOUNTER — Other Ambulatory Visit (HOSPITAL_COMMUNITY): Payer: Self-pay

## 2022-01-05 ENCOUNTER — Other Ambulatory Visit: Payer: Self-pay | Admitting: Family Medicine

## 2022-01-05 DIAGNOSIS — I10 Essential (primary) hypertension: Secondary | ICD-10-CM

## 2022-01-06 ENCOUNTER — Other Ambulatory Visit (HOSPITAL_COMMUNITY): Payer: Self-pay

## 2022-01-06 MED ORDER — AMLODIPINE BESYLATE 5 MG PO TABS
5.0000 mg | ORAL_TABLET | Freq: Two times a day (BID) | ORAL | 0 refills | Status: DC
  Filled 2022-01-06: qty 180, 90d supply, fill #0

## 2022-02-10 ENCOUNTER — Ambulatory Visit: Payer: 59 | Admitting: Family Medicine

## 2022-02-10 ENCOUNTER — Encounter: Payer: Self-pay | Admitting: Family Medicine

## 2022-02-10 ENCOUNTER — Telehealth: Payer: Self-pay

## 2022-02-10 DIAGNOSIS — M10072 Idiopathic gout, left ankle and foot: Secondary | ICD-10-CM

## 2022-02-10 MED ORDER — PREDNISONE 20 MG PO TABS: ORAL_TABLET | ORAL | 0 refills | Status: DC

## 2022-02-10 NOTE — Telephone Encounter (Signed)
Patients wife aware

## 2022-02-10 NOTE — Telephone Encounter (Signed)
I sent medicine to [facility] for the patient

## 2022-02-10 NOTE — Telephone Encounter (Signed)
Prednisone is not available at [facility].  Can this be sent to [place] instead?

## 2022-02-10 NOTE — Progress Notes (Signed)
Virtual Visit via telephone Note  I connected with Michael Hays on 02/10/22 at 0855 by telephone and verified that I am speaking with the correct person using two identifiers. Michael Hays is currently located at home and patient are currently with her during visit. The provider, Fransisca Kaufmann Charmayne Odell, MD is located in their office at time of visit.  Call ended at 0900  I discussed the limitations, risks, security and privacy concerns of performing an evaluation and management service by telephone and the availability of in person appointments. I also discussed with the patient that there may be a patient responsible charge related to this service. The patient expressed understanding and agreed to proceed.   History and Present Illness: Patinet is having gout in the left ankle and has been going on for 3 days.  So colchicine not working and is taking ibuprofen and it helps some.  It is on the left outside of ankle.  He had 30 mg of left over ibuprofen.    1. Acute idiopathic gout of left ankle     Outpatient Encounter Medications as of 02/10/2022  Medication Sig   predniSONE (DELTASONE) 20 MG tablet 2 po at same time daily for 5 days   amLODipine (NORVASC) 5 MG tablet Take 1 tablet (5 mg total) by mouth 2 (two) times daily.   colchicine 0.6 MG tablet TAKE 1 TABLET BY MOUTH DAILY   Evolocumab (REPATHA) 140 MG/ML SOSY Inject 1 Syringe into the skin every 14 (fourteen) days.   ibuprofen (ADVIL) 200 MG tablet Take 200 mg by mouth every 6 (six) hours as needed.   losartan (COZAAR) 50 MG tablet TAKE 1 TABLET BY MOUTH ONCE DAILY   meloxicam (MOBIC) 15 MG tablet Take 15 mg by mouth daily.   nadolol (CORGARD) 40 MG tablet TAKE 1 TABLET BY MOUTH ONCE DAILY   naproxen sodium (ALEVE) 220 MG tablet Take 220 mg by mouth.   No facility-administered encounter medications on file as of 02/10/2022.    Review of Systems  Constitutional:  Negative for chills and fever.  Respiratory:  Negative for  shortness of breath and wheezing.   Cardiovascular:  Negative for chest pain and leg swelling.  Musculoskeletal:  Positive for arthralgias and joint swelling. Negative for back pain and gait problem.  Skin:  Positive for color change. Negative for rash.  All other systems reviewed and are negative.   Observations/Objective: Patient sounds comfortable and in no acute distress  Assessment and Plan: Problem List Items Addressed This Visit       Other   Gout - Primary   Relevant Medications   predniSONE (DELTASONE) 20 MG tablet    We will do short burst prednisone, has already tried colchicine ibuprofen and does not seem to be clearing. Follow up plan: Return if symptoms worsen or fail to improve.     I discussed the assessment and treatment plan with the patient. The patient was provided an opportunity to ask questions and all were answered. The patient agreed with the plan and demonstrated an understanding of the instructions.   The patient was advised to call back or seek an in-person evaluation if the symptoms worsen or if the condition fails to improve as anticipated.  The above assessment and management plan was discussed with the patient. The patient verbalized understanding of and has agreed to the management plan. Patient is aware to call the clinic if symptoms persist or worsen. Patient is aware when to return to the  clinic for a follow-up visit. Patient educated on when it is appropriate to go to the emergency department.    I provided 5 minutes of non-face-to-face time during this encounter.    Worthy Rancher, MD

## 2022-04-16 ENCOUNTER — Other Ambulatory Visit (HOSPITAL_COMMUNITY): Payer: Self-pay

## 2022-04-20 ENCOUNTER — Other Ambulatory Visit (HOSPITAL_COMMUNITY): Payer: Self-pay

## 2022-04-23 ENCOUNTER — Ambulatory Visit (INDEPENDENT_AMBULATORY_CARE_PROVIDER_SITE_OTHER): Payer: 59

## 2022-04-23 ENCOUNTER — Ambulatory Visit (INDEPENDENT_AMBULATORY_CARE_PROVIDER_SITE_OTHER): Payer: 59 | Admitting: Family Medicine

## 2022-04-23 ENCOUNTER — Encounter: Payer: Self-pay | Admitting: Family Medicine

## 2022-04-23 VITALS — BP 162/85 | HR 79 | Temp 98.0°F | Ht 69.0 in | Wt 240.0 lb

## 2022-04-23 DIAGNOSIS — W19XXXA Unspecified fall, initial encounter: Secondary | ICD-10-CM | POA: Diagnosis not present

## 2022-04-23 DIAGNOSIS — M25531 Pain in right wrist: Secondary | ICD-10-CM | POA: Diagnosis not present

## 2022-04-23 DIAGNOSIS — Z043 Encounter for examination and observation following other accident: Secondary | ICD-10-CM | POA: Diagnosis not present

## 2022-04-23 NOTE — Progress Notes (Signed)
BP (!) 162/85   Pulse 79   Temp 98 F (36.7 C)   Ht '5\' 9"'$  (1.753 m)   Wt 240 lb (108.9 kg)   SpO2 95%   BMI 35.44 kg/m    Subjective:   Patient ID: Michael Hays, male    DOB: 1970-10-27, 52 y.o.   MRN: [id number]  HPI: Michael Hays is a 52 y.o. male presenting on 04/23/2022 for Wrist Pain (Right, from fall)   HPI Patient is coming in today for wrist pain on the right.  He does still have grip strength.  He says it was more swollen 2 days ago but the swelling has come down some.  He still has significant pain there.Marland Kitchen  He says 3 days ago he was slipped on some ice and fell backwards onto his hand to catch himself.  Since then he has been having swelling and pain especially on the dorsal medial aspect of the wrist.  Relevant past medical, surgical, family and social history reviewed and updated as indicated. Interim medical history since our last visit reviewed. Allergies and medications reviewed and updated.  Review of Systems  Constitutional:  Negative for chills and fever.  Respiratory:  Negative for shortness of breath and wheezing.   Cardiovascular:  Negative for chest pain and leg swelling.  Musculoskeletal:  Positive for arthralgias and joint swelling. Negative for back pain and gait problem.  Skin:  Negative for color change and rash.  All other systems reviewed and are negative.   Per HPI unless specifically indicated above   Allergies as of 04/23/2022       Reactions   Atenolol    Codeine    Hydrocodone-acetaminophen         Medication List        Accurate as of April 23, 2022  8:54 AM. If you have any questions, ask your nurse or doctor.          STOP taking these medications    meloxicam 15 MG tablet Commonly known as: MOBIC Stopped by: Fransisca Kaufmann Viki Carrera, MD   naproxen sodium 220 MG tablet Commonly known as: ALEVE Stopped by: Fransisca Kaufmann Phebe Dettmer, MD   predniSONE 20 MG tablet Commonly known as: DELTASONE Stopped by: Fransisca Kaufmann Nygel Prokop,  MD       TAKE these medications    amLODipine 5 MG tablet Commonly known as: NORVASC Take 1 tablet (5 mg total) by mouth 2 (two) times daily.   colchicine 0.6 MG tablet TAKE 1 TABLET BY MOUTH DAILY   ibuprofen 200 MG tablet Commonly known as: ADVIL Take 200 mg by mouth every 6 (six) hours as needed.   losartan 50 MG tablet Commonly known as: COZAAR TAKE 1 TABLET BY MOUTH ONCE DAILY   nadolol 40 MG tablet Commonly known as: CORGARD TAKE 1 TABLET BY MOUTH ONCE DAILY   Repatha 140 MG/ML Sosy Generic drug: Evolocumab Inject 1 Syringe into the skin every 14 (fourteen) days.         Objective:   BP (!) 162/85   Pulse 79   Temp 98 F (36.7 C)   Ht '5\' 9"'$  (1.753 m)   Wt 240 lb (108.9 kg)   SpO2 95%   BMI 35.44 kg/m   Wt Readings from Last 3 Encounters:  04/23/22 240 lb (108.9 kg)  10/09/21 231 lb (104.8 kg)  09/25/21 233 lb (105.7 kg)    Physical Exam Vitals and nursing note reviewed.  Constitutional:  Appearance: Normal appearance.  Musculoskeletal:     Right wrist: Swelling, tenderness and bony tenderness present. No deformity, snuff box tenderness or crepitus. Normal range of motion. Normal pulse.  Neurological:     Mental Status: He is alert.     Wrist x-ray: No acute bony abnormality noted on x-ray, await final read from radiology.  Assessment & Plan:   Problem List Items Addressed This Visit   None Visit Diagnoses     Fall, initial encounter    -  Primary   Relevant Orders   DG Wrist Complete Right   Right wrist pain       Relevant Orders   DG Wrist Complete Right       Management with Tylenol and anti-inflammatories and wrist brace gentle movement return to Follow up plan: Return if symptoms worsen or fail to improve.  Counseling provided for all of the vaccine components Orders Placed This Encounter  Procedures   DG Wrist Complete Right    Caryl Pina, MD [facility] 04/23/2022, 8:54 AM

## 2022-04-26 ENCOUNTER — Telehealth (INDEPENDENT_AMBULATORY_CARE_PROVIDER_SITE_OTHER): Payer: 59 | Admitting: Family Medicine

## 2022-04-26 ENCOUNTER — Telehealth: Payer: Self-pay | Admitting: Family Medicine

## 2022-04-26 ENCOUNTER — Encounter: Payer: Self-pay | Admitting: Family Medicine

## 2022-04-26 DIAGNOSIS — M25531 Pain in right wrist: Secondary | ICD-10-CM

## 2022-04-26 MED ORDER — PREDNISONE 20 MG PO TABS: ORAL_TABLET | ORAL | 0 refills | Status: DC

## 2022-04-26 NOTE — Telephone Encounter (Signed)
Schedule him for virtual appointment today and then we can talk over the phone

## 2022-04-26 NOTE — Telephone Encounter (Signed)
Pt states that IBU only helps some (about 2 hours)  He had some left over Prednisone '20mg'$  and took two doses. This seemed to help the most.  Pt would like for something to be called in for pain. Wrist has not improved.

## 2022-04-26 NOTE — Progress Notes (Signed)
Virtual Visit via telephone Note  I connected with Michael Hays on 04/26/22 at 1639 by telephone and verified that I am speaking with the correct person using two identifiers. Michael Hays is currently located at work and patient are currently with her during visit. The provider, Fransisca Kaufmann Jonnell Hentges, MD is located in their office at time of visit.  Call ended at 1647  I discussed the limitations, risks, security and privacy concerns of performing an evaluation and management service by telephone and the availability of in person appointments. I also discussed with the patient that there may be a patient responsible charge related to this service. The patient expressed understanding and agreed to proceed.   History and Present Illness: His wrist is still bothering him and it is not better.  He took 2 pils of prednisone and it helped some.  Ibuprofen and meloxicam are not helping.  He said it is swollen a little still.  He has been wearing a brace but it has moved around some. His Xray was on 1/19/  this is his right hand that is still bother.   1. Right wrist pain     Outpatient Encounter Medications as of 04/26/2022  Medication Sig   predniSONE (DELTASONE) 20 MG tablet Take 3 tabs daily for 1 week, then 2 tabs daily for week 2, then 1 tab daily for week 3.   amLODipine (NORVASC) 5 MG tablet Take 1 tablet (5 mg total) by mouth 2 (two) times daily.   colchicine 0.6 MG tablet TAKE 1 TABLET BY MOUTH DAILY   Evolocumab (REPATHA) 140 MG/ML SOSY Inject 1 Syringe into the skin every 14 (fourteen) days.   ibuprofen (ADVIL) 200 MG tablet Take 200 mg by mouth every 6 (six) hours as needed.   losartan (COZAAR) 50 MG tablet TAKE 1 TABLET BY MOUTH ONCE DAILY   nadolol (CORGARD) 40 MG tablet TAKE 1 TABLET BY MOUTH ONCE DAILY   No facility-administered encounter medications on file as of 04/26/2022.    Review of Systems  Constitutional:  Negative for chills and fever.  Eyes:  Negative for visual  disturbance.  Respiratory:  Negative for shortness of breath and wheezing.   Cardiovascular:  Negative for chest pain and leg swelling.  Musculoskeletal:  Positive for arthralgias and joint swelling. Negative for back pain and gait problem.  Skin:  Negative for color change and rash.  All other systems reviewed and are negative.   Observations/Objective: Patient sounds comfortable and in no acute distress.   Assessment and Plan: Problem List Items Addressed This Visit   None Visit Diagnoses     Right wrist pain    -  Primary   Relevant Medications   predniSONE (DELTASONE) 20 MG tablet       Possibly said a little with gout in that joint after the trauma, will do short course of prednisone steroids.  X-ray was normal so it should help with healing Follow up plan: Return if symptoms worsen or fail to improve.     I discussed the assessment and treatment plan with the patient. The patient was provided an opportunity to ask questions and all were answered. The patient agreed with the plan and demonstrated an understanding of the instructions.   The patient was advised to call back or seek an in-person evaluation if the symptoms worsen or if the condition fails to improve as anticipated.  The above assessment and management plan was discussed with the patient. The patient verbalized understanding  of and has agreed to the management plan. Patient is aware to call the clinic if symptoms persist or worsen. Patient is aware when to return to the clinic for a follow-up visit. Patient educated on when it is appropriate to go to the emergency department.    I provided 8 minutes of non-face-to-face time during this encounter.    Worthy Rancher, MD

## 2022-04-26 NOTE — Telephone Encounter (Signed)
Appt scheduled at 3:55 1/22

## 2022-06-28 ENCOUNTER — Other Ambulatory Visit: Payer: Self-pay

## 2022-06-28 ENCOUNTER — Other Ambulatory Visit (HOSPITAL_COMMUNITY): Payer: Self-pay

## 2022-07-06 ENCOUNTER — Other Ambulatory Visit: Payer: Self-pay | Admitting: Family Medicine

## 2022-07-06 DIAGNOSIS — Z9189 Other specified personal risk factors, not elsewhere classified: Secondary | ICD-10-CM

## 2022-07-06 DIAGNOSIS — E782 Mixed hyperlipidemia: Secondary | ICD-10-CM

## 2022-07-07 ENCOUNTER — Other Ambulatory Visit (HOSPITAL_COMMUNITY): Payer: Self-pay

## 2022-07-07 ENCOUNTER — Other Ambulatory Visit: Payer: Self-pay

## 2022-07-07 MED ORDER — REPATHA 140 MG/ML ~~LOC~~ SOSY
1.0000 | PREFILLED_SYRINGE | SUBCUTANEOUS | 0 refills | Status: DC
  Filled 2022-07-07: qty 6, 84d supply, fill #0

## 2022-07-25 ENCOUNTER — Other Ambulatory Visit: Payer: Self-pay | Admitting: Family Medicine

## 2022-07-25 DIAGNOSIS — I10 Essential (primary) hypertension: Secondary | ICD-10-CM

## 2022-07-26 ENCOUNTER — Other Ambulatory Visit (HOSPITAL_COMMUNITY): Payer: Self-pay

## 2022-07-26 ENCOUNTER — Other Ambulatory Visit: Payer: Self-pay

## 2022-07-26 MED ORDER — AMLODIPINE BESYLATE 5 MG PO TABS
5.0000 mg | ORAL_TABLET | Freq: Two times a day (BID) | ORAL | 0 refills | Status: DC
  Filled 2022-07-26: qty 180, 90d supply, fill #0

## 2022-09-06 ENCOUNTER — Encounter: Payer: Self-pay | Admitting: Family Medicine

## 2022-09-06 ENCOUNTER — Ambulatory Visit (INDEPENDENT_AMBULATORY_CARE_PROVIDER_SITE_OTHER): Payer: 59 | Admitting: Family Medicine

## 2022-09-06 VITALS — BP 160/88 | HR 70 | Temp 98.2°F | Ht 69.0 in | Wt 241.0 lb

## 2022-09-06 DIAGNOSIS — H66002 Acute suppurative otitis media without spontaneous rupture of ear drum, left ear: Secondary | ICD-10-CM | POA: Diagnosis not present

## 2022-09-06 DIAGNOSIS — J0110 Acute frontal sinusitis, unspecified: Secondary | ICD-10-CM | POA: Diagnosis not present

## 2022-09-06 MED ORDER — AMOXICILLIN-POT CLAVULANATE 875-125 MG PO TABS: 1.0000 | ORAL_TABLET | Freq: Two times a day (BID) | ORAL | 0 refills | Status: DC

## 2022-09-06 MED ORDER — BENZONATATE 200 MG PO CAPS: 200.0000 mg | ORAL_CAPSULE | Freq: Two times a day (BID) | ORAL | 1 refills | Status: DC | PRN

## 2022-09-06 NOTE — Progress Notes (Signed)
BP (!) 160/88   Pulse 70   Temp 98.2 F (36.8 C)   Ht 5\' 9"  (1.753 m)   Wt 241 lb (109.3 kg)   SpO2 95%   BMI 35.59 kg/m    Subjective:   Patient ID: Michael Hays, male    DOB: 24-Dec-1970, 52 y.o.   MRN: [id number]  HPI: Michael Hays is a 52 y.o. male presenting on 09/06/2022 for URI (Started 4-5 days ago- believes he has bad sinus infection)   HPI Patient is coming today with sinus pressure congestion and drainage and cough.  He has been getting a lot of cough and chest congestion that is been developing from the sinus pressure and drainage.  He also says yesterday morning and last night he had a high fever and felt very feverish although he did not take his temperature.  He has been using Coricidin and Mucinex and Tylenol cold and sinus and is just not getting any better.  His wife had something similar before him and it took her a couple rounds of treatment to get better.  He did take 1 or 2 of her amoxicillin doses.  He does not feel like that helped as of yet.  He denies any shortness of breath or wheezing.  He does have some lower rib pain from all of the coughing he has been doing.  Relevant past medical, surgical, family and social history reviewed and updated as indicated. Interim medical history since our last visit reviewed. Allergies and medications reviewed and updated.  Review of Systems  Constitutional:  Negative for chills and fever.  HENT:  Positive for congestion, postnasal drip, rhinorrhea and sinus pressure. Negative for ear discharge, ear pain, sneezing, sore throat and voice change.   Eyes:  Negative for pain, discharge, redness and visual disturbance.  Respiratory:  Positive for cough. Negative for shortness of breath and wheezing.   Cardiovascular:  Negative for chest pain and leg swelling.  Musculoskeletal:  Negative for back pain and gait problem.  Skin:  Negative for rash.  All other systems reviewed and are negative.   Per HPI unless specifically  indicated above   Allergies as of 09/06/2022       Reactions   Atenolol    Codeine    Hydrocodone-acetaminophen         Medication List        Accurate as of September 06, 2022  8:52 AM. If you have any questions, ask your nurse or doctor.          amLODipine 5 MG tablet Commonly known as: NORVASC Take 1 tablet (5 mg total) by mouth 2 (two) times daily.   amoxicillin-clavulanate 875-125 MG tablet Commonly known as: AUGMENTIN Take 1 tablet by mouth 2 (two) times daily. Started by: [name] Armentha Branagan, MD   benzonatate 200 MG capsule Commonly known as: TESSALON Take 1 capsule (200 mg total) by mouth 2 (two) times daily as needed for cough. Started by: [name] Teyla Skidgel, MD   colchicine 0.6 MG tablet TAKE 1 TABLET BY MOUTH DAILY   ibuprofen 200 MG tablet Commonly known as: ADVIL Take 200 mg by mouth every 6 (six) hours as needed.   losartan 50 MG tablet Commonly known as: COZAAR TAKE 1 TABLET BY MOUTH ONCE DAILY   nadolol 40 MG tablet Commonly known as: CORGARD TAKE 1 TABLET BY MOUTH ONCE DAILY   predniSONE 20 MG tablet Commonly known as: DELTASONE Take 3 tabs daily for 1 week, then  2 tabs daily for week 2, then 1 tab daily for week 3.   Repatha 140 MG/ML Sosy Generic drug: Evolocumab Inject 1 Syringe into the skin every 14 (fourteen) days.         Objective:   BP (!) 160/88   Pulse 70   Temp 98.2 F (36.8 C)   Ht 5\' 9"  (1.753 m)   Wt 241 lb (109.3 kg)   SpO2 95%   BMI 35.59 kg/m   Wt Readings from Last 3 Encounters:  09/06/22 241 lb (109.3 kg)  04/23/22 240 lb (108.9 kg)  10/09/21 231 lb (104.8 kg)    Physical Exam Vitals and nursing note reviewed.  Constitutional:      General: He is not in acute distress.    Appearance: He is well-developed. He is not diaphoretic.  HENT:     Right Ear: Ear canal and external ear normal. Tympanic membrane is retracted.     Left Ear: Ear canal and external ear normal. Tympanic membrane is injected,  erythematous and retracted.     Nose: Mucosal edema present. No rhinorrhea.     Right Sinus: No maxillary sinus tenderness or frontal sinus tenderness.     Left Sinus: No maxillary sinus tenderness or frontal sinus tenderness.     Mouth/Throat:     Pharynx: Uvula midline. No oropharyngeal exudate or posterior oropharyngeal erythema.     Tonsils: No tonsillar abscesses.  Eyes:     General: No scleral icterus.       Right eye: No discharge.     Conjunctiva/sclera: Conjunctivae normal.     Pupils: Pupils are equal, round, and reactive to light.  Neck:     Thyroid: No thyromegaly.  Cardiovascular:     Rate and Rhythm: Normal rate and regular rhythm.     Heart sounds: Normal heart sounds. No murmur heard. Pulmonary:     Effort: Pulmonary effort is normal. No respiratory distress.     Breath sounds: Normal breath sounds. No wheezing or rales.  Musculoskeletal:        General: Normal range of motion.     Cervical back: Neck supple.  Lymphadenopathy:     Cervical: No cervical adenopathy.  Skin:    General: Skin is warm and dry.     Findings: No rash.  Neurological:     Mental Status: He is alert and oriented to person, place, and time.     Coordination: Coordination normal.  Psychiatric:        Behavior: Behavior normal.       Assessment & Plan:   Problem List Items Addressed This Visit   None Visit Diagnoses     Non-recurrent acute suppurative otitis media of left ear without spontaneous rupture of tympanic membrane    -  Primary   Relevant Medications   amoxicillin-clavulanate (AUGMENTIN) 875-125 MG tablet   Acute non-recurrent frontal sinusitis       Relevant Medications   amoxicillin-clavulanate (AUGMENTIN) 875-125 MG tablet   benzonatate (TESSALON) 200 MG capsule       Will do Augmentin and recommended that he did Flonase and Coricidin or Mucinex. I also sent some cough Tessalon Perles for him if needed Follow up plan: Return if symptoms worsen or fail to  improve.  Counseling provided for all of the vaccine components No orders of the defined types were placed in this encounter.   Arville Care, MD [facility] 09/06/2022, 8:52 AM

## 2022-10-06 ENCOUNTER — Other Ambulatory Visit (HOSPITAL_COMMUNITY): Payer: Self-pay

## 2022-10-06 ENCOUNTER — Other Ambulatory Visit: Payer: Self-pay | Admitting: Family Medicine

## 2022-10-06 DIAGNOSIS — Z9189 Other specified personal risk factors, not elsewhere classified: Secondary | ICD-10-CM

## 2022-10-06 DIAGNOSIS — E782 Mixed hyperlipidemia: Secondary | ICD-10-CM

## 2022-10-07 MED ORDER — REPATHA 140 MG/ML ~~LOC~~ SOSY
140.0000 mg | PREFILLED_SYRINGE | SUBCUTANEOUS | 0 refills | Status: DC
  Filled 2022-10-07: qty 6, 84d supply, fill #0

## 2022-10-08 ENCOUNTER — Encounter (HOSPITAL_COMMUNITY): Payer: Self-pay

## 2022-10-08 ENCOUNTER — Other Ambulatory Visit: Payer: Self-pay

## 2022-10-08 ENCOUNTER — Other Ambulatory Visit (HOSPITAL_COMMUNITY): Payer: Self-pay

## 2022-10-12 ENCOUNTER — Other Ambulatory Visit: Payer: Self-pay

## 2022-10-12 ENCOUNTER — Other Ambulatory Visit: Payer: 59

## 2022-10-12 DIAGNOSIS — I10 Essential (primary) hypertension: Secondary | ICD-10-CM

## 2022-10-12 DIAGNOSIS — Z8739 Personal history of other diseases of the musculoskeletal system and connective tissue: Secondary | ICD-10-CM

## 2022-10-12 DIAGNOSIS — E782 Mixed hyperlipidemia: Secondary | ICD-10-CM | POA: Diagnosis not present

## 2022-10-12 LAB — CMP14+EGFR
BUN/Creatinine Ratio: 15 (ref 9–20)
Bilirubin Total: 0.4 mg/dL (ref 0.0–1.2)
CO2: 21 mmol/L (ref 20–29)
Chloride: 99 mmol/L (ref 96–106)
Creatinine, Ser: 0.81 mg/dL (ref 0.76–1.27)
Glucose: 116 mg/dL — ABNORMAL HIGH (ref 70–99)
Potassium: 4.7 mmol/L (ref 3.5–5.2)
Sodium: 138 mmol/L (ref 134–144)
Total Protein: 7.1 g/dL (ref 6.0–8.5)
eGFR: 107 mL/min/{1.73_m2} (ref >59)

## 2022-10-12 LAB — CBC WITH DIFFERENTIAL/PLATELET
Immature Grans (Abs): 0 10*3/uL (ref 0.0–0.1)
Immature Granulocytes: 1 %
Lymphocytes Absolute: 1.2 10*3/uL (ref 0.7–3.1)
Lymphs: 23 %
MCH: 29.5 pg (ref 26.6–33.0)
Monocytes Absolute: 0.7 10*3/uL (ref 0.1–0.9)
Neutrophils Absolute: 2.9 10*3/uL (ref 1.4–7.0)
Platelets: 243 10*3/uL (ref 150–450)
RDW: 13.1 % (ref 11.6–15.4)
WBC: 5.1 10*3/uL (ref 3.4–10.8)

## 2022-10-12 LAB — LIPID PANEL
Cholesterol, Total: 176 mg/dL (ref 100–199)
LDL Chol Calc (NIH): 82 mg/dL (ref 0–99)
Triglycerides: 258 mg/dL — ABNORMAL HIGH (ref 0–149)
VLDL Cholesterol Cal: 42 mg/dL — ABNORMAL HIGH (ref 5–40)

## 2022-10-12 LAB — URIC ACID

## 2022-10-13 LAB — CMP14+EGFR
ALT: 59 IU/L — ABNORMAL HIGH (ref 0–44)
AST: 47 IU/L — ABNORMAL HIGH (ref 0–40)
Albumin: 4.5 g/dL (ref 3.8–4.9)
Alkaline Phosphatase: 93 IU/L (ref 44–121)
BUN: 12 mg/dL (ref 6–24)
Calcium: 9.5 mg/dL (ref 8.7–10.2)
Globulin, Total: 2.6 g/dL (ref 1.5–4.5)

## 2022-10-13 LAB — CBC WITH DIFFERENTIAL/PLATELET
Basophils Absolute: 0.1 10*3/uL (ref 0.0–0.2)
Basos: 2 %
EOS (ABSOLUTE): 0.2 10*3/uL (ref 0.0–0.4)
Eos: 4 %
Hematocrit: 47.6 % (ref 37.5–51.0)
Hemoglobin: 15.8 g/dL (ref 13.0–17.7)
MCHC: 33.2 g/dL (ref 31.5–35.7)
MCV: 89 fL (ref 79–97)
Monocytes: 14 %
Neutrophils: 56 %
RBC: 5.35 x10E6/uL (ref 4.14–5.80)

## 2022-10-13 LAB — LIPID PANEL
Chol/HDL Ratio: 3.4 ratio (ref 0.0–5.0)
HDL: 52 mg/dL (ref >39)

## 2022-10-15 ENCOUNTER — Encounter: Payer: Self-pay | Admitting: Family Medicine

## 2022-10-15 ENCOUNTER — Ambulatory Visit: Payer: 59 | Admitting: Family Medicine

## 2022-10-15 VITALS — BP 144/87 | HR 76 | Ht 69.0 in | Wt 241.0 lb

## 2022-10-15 DIAGNOSIS — I10 Essential (primary) hypertension: Secondary | ICD-10-CM

## 2022-10-15 DIAGNOSIS — M1A0720 Idiopathic chronic gout, left ankle and foot, without tophus (tophi): Secondary | ICD-10-CM | POA: Diagnosis not present

## 2022-10-15 DIAGNOSIS — M25531 Pain in right wrist: Secondary | ICD-10-CM | POA: Diagnosis not present

## 2022-10-15 DIAGNOSIS — E782 Mixed hyperlipidemia: Secondary | ICD-10-CM

## 2022-10-15 DIAGNOSIS — Z0001 Encounter for general adult medical examination with abnormal findings: Secondary | ICD-10-CM | POA: Diagnosis not present

## 2022-10-15 DIAGNOSIS — Z0000 Encounter for general adult medical examination without abnormal findings: Secondary | ICD-10-CM

## 2022-10-15 DIAGNOSIS — Z1211 Encounter for screening for malignant neoplasm of colon: Secondary | ICD-10-CM

## 2022-10-15 DIAGNOSIS — Z9189 Other specified personal risk factors, not elsewhere classified: Secondary | ICD-10-CM

## 2022-10-15 MED ORDER — NADOLOL 40 MG PO TABS
ORAL_TABLET | Freq: Every day | ORAL | 3 refills | Status: DC
Start: 2022-10-15 — End: 2022-10-18

## 2022-10-15 MED ORDER — PREDNISONE 20 MG PO TABS: ORAL_TABLET | ORAL | 0 refills | Status: DC

## 2022-10-15 MED ORDER — COLCHICINE 0.6 MG PO TABS
ORAL_TABLET | Freq: Every day | ORAL | 2 refills | Status: DC
Start: 2022-10-15 — End: 2024-01-09

## 2022-10-15 MED ORDER — COLCHICINE 0.6 MG PO TABS
ORAL_TABLET | Freq: Every day | ORAL | 2 refills | Status: DC
Start: 2022-10-15 — End: 2022-10-15

## 2022-10-15 MED ORDER — LOSARTAN POTASSIUM 50 MG PO TABS: ORAL_TABLET | Freq: Every day | ORAL | 3 refills | Status: DC

## 2022-10-15 MED ORDER — PREDNISONE 20 MG PO TABS
ORAL_TABLET | ORAL | 0 refills | Status: DC
Start: 2022-10-15 — End: 2023-01-17

## 2022-10-15 MED ORDER — AMLODIPINE BESYLATE 5 MG PO TABS: 5.0000 mg | ORAL_TABLET | Freq: Two times a day (BID) | ORAL | 0 refills | Status: DC

## 2022-10-15 MED ORDER — REPATHA 140 MG/ML ~~LOC~~ SOSY
140.0000 mg | PREFILLED_SYRINGE | SUBCUTANEOUS | 3 refills | Status: DC
Start: 2022-10-15 — End: 2022-10-18

## 2022-10-15 NOTE — Progress Notes (Signed)
BP (!) 144/87   Pulse 76   Ht 5\' 9"  (1.753 m)   Wt 241 lb (109.3 kg)   SpO2 94%   BMI 35.59 kg/m    Subjective:   Patient ID: Michael Hays, male    DOB: 1970/06/29, 52 y.o.   MRN: [id number]  HPI: Michael Hays is a 52 y.o. male presenting on 10/15/2022 for Medical Management of Chronic Issues (CPE)   HPI Physical exam Patient denies any chest pain, shortness of breath, headaches or vision issues, abdominal complaints, diarrhea, nausea, vomiting, or joint issues.  On his blood work his liver function was up and he does admit that he averages about 6 beers a day, sometimes will drink 12 sometimes will drink none but on average she thinks about 6 a day.  We discussed fatty fried greasy and oily foods and reduction for that because triglycerides were also elevated  Hypertension Patient is currently on amlodipine and losartan and nadolol, and their blood pressure today is 144/87. Patient denies any lightheadedness or dizziness. Patient denies headaches, blurred vision, chest pains, shortness of breath, or weakness. Denies any side effects from medication and is content with current medication.   Hyperlipidemia and hypertriglyceridemia Patient is coming in for recheck of his hyperlipidemia. The patient is currently taking Repatha. They deny any issues with myalgias or history of liver damage from it. They deny any focal numbness or weakness or chest pain.   Gout Last attack: Few months ago, small attack Attacks this year: A couple Medication: Colchicine and prednisone as needed Location of attacks: Feet ankles knees  Relevant past medical, surgical, family and social history reviewed and updated as indicated. Interim medical history since our last visit reviewed. Allergies and medications reviewed and updated.  Review of Systems  Constitutional:  Negative for chills and fever.  HENT:  Negative for ear pain and tinnitus.   Eyes:  Negative for pain and visual disturbance.   Respiratory:  Negative for cough, shortness of breath and wheezing.   Cardiovascular:  Negative for chest pain, palpitations and leg swelling.  Gastrointestinal:  Negative for abdominal pain, blood in stool, constipation and diarrhea.  Genitourinary:  Negative for dysuria and hematuria.  Musculoskeletal:  Positive for arthralgias. Negative for back pain, gait problem and myalgias.  Skin:  Negative for rash.  Neurological:  Negative for dizziness, weakness and headaches.  Psychiatric/Behavioral:  Negative for suicidal ideas.   All other systems reviewed and are negative.   Per HPI unless specifically indicated above   Allergies as of 10/15/2022       Reactions   Atenolol    Codeine    Hydrocodone-acetaminophen         Medication List        Accurate as of October 15, 2022  8:15 AM. If you have any questions, ask your nurse or doctor.          STOP taking these medications    amoxicillin-clavulanate 875-125 MG tablet Commonly known as: AUGMENTIN Stopped by: [name] Raydel Hosick       TAKE these medications    amLODipine 5 MG tablet Commonly known as: NORVASC Take 1 tablet (5 mg total) by mouth 2 (two) times daily.   benzonatate 200 MG capsule Commonly known as: TESSALON Take 1 capsule (200 mg total) by mouth 2 (two) times daily as needed for cough.   colchicine 0.6 MG tablet TAKE 1 TABLET BY MOUTH DAILY   ibuprofen 200 MG tablet Commonly known as: ADVIL  Take 200 mg by mouth every 6 (six) hours as needed.   losartan 50 MG tablet Commonly known as: COZAAR TAKE 1 TABLET BY MOUTH ONCE DAILY   nadolol 40 MG tablet Commonly known as: CORGARD TAKE 1 TABLET BY MOUTH ONCE DAILY   predniSONE 20 MG tablet Commonly known as: DELTASONE Take 3 tabs daily for 1 week, then 2 tabs daily for week 2, then 1 tab daily for week 3.   Repatha 140 MG/ML Sosy Generic drug: Evolocumab Inject 140 mg into the skin every 14 (fourteen) days.         Objective:   BP (!)  144/87   Pulse 76   Ht 5\' 9"  (1.753 m)   Wt 241 lb (109.3 kg)   SpO2 94%   BMI 35.59 kg/m   Wt Readings from Last 3 Encounters:  10/15/22 241 lb (109.3 kg)  09/06/22 241 lb (109.3 kg)  04/23/22 240 lb (108.9 kg)    Physical Exam Vitals reviewed.  Constitutional:      General: He is not in acute distress.    Appearance: He is well-developed. He is not diaphoretic.  HENT:     Right Ear: External ear normal.     Left Ear: External ear normal.     Nose: Nose normal.     Mouth/Throat:     Pharynx: No oropharyngeal exudate.  Eyes:     General: No scleral icterus.       Right eye: No discharge.     Conjunctiva/sclera: Conjunctivae normal.     Pupils: Pupils are equal, round, and reactive to light.  Neck:     Thyroid: No thyromegaly.  Cardiovascular:     Rate and Rhythm: Normal rate and regular rhythm.     Heart sounds: Normal heart sounds. No murmur heard. Pulmonary:     Effort: Pulmonary effort is normal. No respiratory distress.     Breath sounds: Normal breath sounds. No wheezing.  Abdominal:     General: Bowel sounds are normal. There is no distension.     Palpations: Abdomen is soft.     Tenderness: There is no abdominal tenderness. There is no guarding or rebound.  Musculoskeletal:        General: Normal range of motion.     Cervical back: Neck supple.  Lymphadenopathy:     Cervical: No cervical adenopathy.  Skin:    General: Skin is warm and dry.     Findings: No rash.  Neurological:     Mental Status: He is alert and oriented to person, place, and time.     Coordination: Coordination normal.  Psychiatric:        Behavior: Behavior normal.     Results for orders placed or performed in visit on 10/12/22  Uric acid  Result Value Ref Range   Uric Acid 9.0 (H) 3.8 - 8.4 mg/dL  CBC with Differential/Platelet  Result Value Ref Range   WBC 5.1 3.4 - 10.8 x10E3/uL   RBC 5.35 4.14 - 5.80 x10E6/uL   Hemoglobin 15.8 13.0 - 17.7 g/dL   Hematocrit 16.1 09.6 - 51.0  %   MCV 89 79 - 97 fL   MCH 29.5 26.6 - 33.0 pg   MCHC 33.2 31.5 - 35.7 g/dL   RDW 04.5 40.9 - 81.1 %   Platelets 243 150 - 450 x10E3/uL   Neutrophils 56 Not Estab. %   Lymphs 23 Not Estab. %   Monocytes 14 Not Estab. %   Eos 4 Not  Estab. %   Basos 2 Not Estab. %   Neutrophils Absolute 2.9 1.4 - 7.0 x10E3/uL   Lymphocytes Absolute 1.2 0.7 - 3.1 x10E3/uL   Monocytes Absolute 0.7 0.1 - 0.9 x10E3/uL   EOS (ABSOLUTE) 0.2 0.0 - 0.4 x10E3/uL   Basophils Absolute 0.1 0.0 - 0.2 x10E3/uL   Immature Granulocytes 1 Not Estab. %   Immature Grans (Abs) 0.0 0.0 - 0.1 x10E3/uL  CMP14+EGFR  Result Value Ref Range   Glucose 116 (H) 70 - 99 mg/dL   BUN 12 6 - 24 mg/dL   Creatinine, Ser 0.10 0.76 - 1.27 mg/dL   eGFR 932 >35 TD/DUK/0.25   BUN/Creatinine Ratio 15 9 - 20   Sodium 138 134 - 144 mmol/L   Potassium 4.7 3.5 - 5.2 mmol/L   Chloride 99 96 - 106 mmol/L   CO2 21 20 - 29 mmol/L   Calcium 9.5 8.7 - 10.2 mg/dL   Total Protein 7.1 6.0 - 8.5 g/dL   Albumin 4.5 3.8 - 4.9 g/dL   Globulin, Total 2.6 1.5 - 4.5 g/dL   Bilirubin Total 0.4 0.0 - 1.2 mg/dL   Alkaline Phosphatase 93 44 - 121 IU/L   AST 47 (H) 0 - 40 IU/L   ALT 59 (H) 0 - 44 IU/L  Lipid panel  Result Value Ref Range   Cholesterol, Total 176 100 - 199 mg/dL   Triglycerides 427 (H) 0 - 149 mg/dL   HDL 52 >06 mg/dL   VLDL Cholesterol Cal 42 (H) 5 - 40 mg/dL   LDL Chol Calc (NIH) 82 0 - 99 mg/dL   Chol/HDL Ratio 3.4 0.0 - 5.0 ratio    Assessment & Plan:   Problem List Items Addressed This Visit       Cardiovascular and Mediastinum   Essential hypertension   Relevant Medications   amLODipine (NORVASC) 5 MG tablet   Evolocumab (REPATHA) 140 MG/ML SOSY   losartan (COZAAR) 50 MG tablet   nadolol (CORGARD) 40 MG tablet     Other   HLD (hyperlipidemia)   Relevant Medications   amLODipine (NORVASC) 5 MG tablet   Evolocumab (REPATHA) 140 MG/ML SOSY   losartan (COZAAR) 50 MG tablet   nadolol (CORGARD) 40 MG tablet   Gout    Relevant Medications   predniSONE (DELTASONE) 20 MG tablet   colchicine 0.6 MG tablet   Other Visit Diagnoses     Physical exam    -  Primary   10 year risk of MI or stroke 7.5% or greater       Relevant Medications   Evolocumab (REPATHA) 140 MG/ML SOSY   Right wrist pain           Elevated uric acid but has not had any major gout attacks recently.  Elevated liver function, we discussed alcohol intake it seems like he is taking excessive and he will try and reduce and then we will recheck it months. Follow up plan: Return in about 3 months (around 01/15/2023), or if symptoms worsen or fail to improve, for Liver function recheck and PSA.  Counseling provided for all of the vaccine components No orders of the defined types were placed in this encounter.   Arville Care, MD [facility] 10/15/2022, 8:15 AM

## 2022-10-15 NOTE — Addendum Note (Signed)
Addended by: Arville Care on: 10/15/2022 08:18 AM   Modules accepted: Orders

## 2022-10-18 ENCOUNTER — Other Ambulatory Visit: Payer: Self-pay

## 2022-10-18 ENCOUNTER — Other Ambulatory Visit (HOSPITAL_COMMUNITY): Payer: Self-pay

## 2022-10-18 ENCOUNTER — Encounter (HOSPITAL_COMMUNITY): Payer: Self-pay

## 2022-10-18 MED ORDER — NADOLOL 40 MG PO TABS
40.0000 mg | ORAL_TABLET | Freq: Every day | ORAL | 3 refills | Status: DC
Start: 2022-10-18 — End: 2023-01-17
  Filled 2022-10-18: qty 90, 90d supply, fill #0

## 2022-10-18 MED ORDER — REPATHA 140 MG/ML ~~LOC~~ SOSY
140.0000 mg | PREFILLED_SYRINGE | SUBCUTANEOUS | 3 refills | Status: DC
  Filled 2022-10-18 – 2022-10-26 (×2): qty 6, 84d supply, fill #0
  Filled 2023-01-19: qty 6, 84d supply, fill #1
  Filled 2023-04-14: qty 6, 84d supply, fill #2
  Filled 2023-07-04: qty 6, 84d supply, fill #3

## 2022-10-18 MED ORDER — LOSARTAN POTASSIUM 50 MG PO TABS
50.0000 mg | ORAL_TABLET | Freq: Every day | ORAL | 3 refills | Status: DC
  Filled 2022-10-18: qty 90, 90d supply, fill #0

## 2022-10-18 MED ORDER — AMLODIPINE BESYLATE 5 MG PO TABS
5.0000 mg | ORAL_TABLET | Freq: Two times a day (BID) | ORAL | 0 refills | Status: DC
Start: 2022-10-18 — End: 2023-01-17
  Filled 2022-10-18: qty 180, 90d supply, fill #0

## 2022-10-18 NOTE — Addendum Note (Signed)
Addended by: Julious Payer D on: 10/18/2022 09:26 AM   Modules accepted: Orders

## 2022-10-18 NOTE — Progress Notes (Signed)
Maintenance meds went to incorrect pharmacy, sent to correct one.

## 2022-10-26 ENCOUNTER — Other Ambulatory Visit (HOSPITAL_COMMUNITY): Payer: Self-pay

## 2022-10-27 ENCOUNTER — Other Ambulatory Visit: Payer: Self-pay

## 2023-01-07 ENCOUNTER — Other Ambulatory Visit: Payer: Self-pay | Admitting: Family Medicine

## 2023-01-07 DIAGNOSIS — Z1212 Encounter for screening for malignant neoplasm of rectum: Secondary | ICD-10-CM

## 2023-01-07 DIAGNOSIS — Z1211 Encounter for screening for malignant neoplasm of colon: Secondary | ICD-10-CM

## 2023-01-17 ENCOUNTER — Other Ambulatory Visit (HOSPITAL_COMMUNITY): Payer: Self-pay

## 2023-01-17 ENCOUNTER — Ambulatory Visit (INDEPENDENT_AMBULATORY_CARE_PROVIDER_SITE_OTHER): Payer: 59 | Admitting: Family Medicine

## 2023-01-17 ENCOUNTER — Encounter: Payer: Self-pay | Admitting: Family Medicine

## 2023-01-17 VITALS — BP 139/83 | HR 70 | Ht 69.0 in | Wt 239.0 lb

## 2023-01-17 DIAGNOSIS — E782 Mixed hyperlipidemia: Secondary | ICD-10-CM | POA: Diagnosis not present

## 2023-01-17 DIAGNOSIS — M10072 Idiopathic gout, left ankle and foot: Secondary | ICD-10-CM

## 2023-01-17 DIAGNOSIS — E781 Pure hyperglyceridemia: Secondary | ICD-10-CM

## 2023-01-17 DIAGNOSIS — I10 Essential (primary) hypertension: Secondary | ICD-10-CM | POA: Diagnosis not present

## 2023-01-17 DIAGNOSIS — Z9189 Other specified personal risk factors, not elsewhere classified: Secondary | ICD-10-CM

## 2023-01-17 DIAGNOSIS — M1A0720 Idiopathic chronic gout, left ankle and foot, without tophus (tophi): Secondary | ICD-10-CM

## 2023-01-17 DIAGNOSIS — R7989 Other specified abnormal findings of blood chemistry: Secondary | ICD-10-CM | POA: Diagnosis not present

## 2023-01-17 MED ORDER — NADOLOL 40 MG PO TABS
40.0000 mg | ORAL_TABLET | Freq: Every day | ORAL | 3 refills | Status: DC
  Filled 2023-01-17: qty 90, 90d supply, fill #0
  Filled 2023-04-14: qty 90, 90d supply, fill #1
  Filled 2023-07-12: qty 90, 90d supply, fill #2
  Filled 2023-10-09: qty 90, 90d supply, fill #3

## 2023-01-17 MED ORDER — LOSARTAN POTASSIUM 50 MG PO TABS
50.0000 mg | ORAL_TABLET | Freq: Every day | ORAL | 3 refills | Status: DC
  Filled 2023-01-17: qty 90, 90d supply, fill #0
  Filled 2023-07-12: qty 90, 90d supply, fill #1
  Filled 2023-10-09: qty 90, 90d supply, fill #2

## 2023-01-17 MED ORDER — AMLODIPINE BESYLATE 5 MG PO TABS
5.0000 mg | ORAL_TABLET | Freq: Two times a day (BID) | ORAL | 3 refills | Status: DC
Start: 2023-01-17 — End: 2023-08-09

## 2023-01-17 NOTE — Progress Notes (Signed)
BP 139/83   Pulse 70   Ht 5\' 9"  (1.753 m)   Wt 239 lb (108.4 kg)   SpO2 95%   BMI 35.29 kg/m    Subjective:   Patient ID: Michael Hays, male    DOB: [birth year], 52 y.o.   MRN: [id number]  HPI: Michael Hays is a 52 y.o. male presenting on 01/17/2023 for Medical Management of Chronic Issues, Hypertension, and Gout   HPI Hypertension Patient is currently on losartan and nadolol and amlodipine, and their blood pressure today is 139/83. Patient denies any lightheadedness or dizziness. Patient denies headaches, blurred vision, chest pains, shortness of breath, or weakness. Denies any side effects from medication and is content with current medication.   Hyperlipidemia and hypertriglyceridemia recheck Patient is coming in for recheck of his hyperlipidemia. The patient is currently taking Repatha. They deny any issues with myalgias or history of liver damage from it. They deny any focal numbness or weakness or chest pain.   Gout Last attack: 2 weeks ago Attacks this year: 6 or 7 Medication: Colchicine as needed and prednisone as needed Location of attacks: Left ankle  Patient had elevated liver function test on his last draw and has been trying to change his diet and reduce alcohol.  Relevant past medical, surgical, family and social history reviewed and updated as indicated. Interim medical history since our last visit reviewed. Allergies and medications reviewed and updated.  Review of Systems  Constitutional:  Negative for chills and fever.  Eyes:  Negative for visual disturbance.  Respiratory:  Negative for shortness of breath and wheezing.   Cardiovascular:  Negative for chest pain and leg swelling.  Musculoskeletal:  Negative for back pain and gait problem.  Skin:  Negative for rash.  Neurological:  Negative for dizziness and light-headedness.  All other systems reviewed and are negative.   Per HPI unless specifically indicated above   Allergies as of 01/17/2023        Reactions   Atenolol    Codeine    Hydrocodone-acetaminophen         Medication List        Accurate as of January 17, 2023  8:54 AM. If you have any questions, ask your nurse or doctor.          STOP taking these medications    benzonatate 200 MG capsule Commonly known as: TESSALON Stopped by: [name] Candace Begue   predniSONE 20 MG tablet Commonly known as: DELTASONE Stopped by: [name] Quinten Allerton       TAKE these medications    amLODipine 5 MG tablet Commonly known as: NORVASC Take 1 tablet (5 mg total) by mouth 2 (two) times daily.   colchicine 0.6 MG tablet TAKE 1 TABLET BY MOUTH DAILY   ibuprofen 200 MG tablet Commonly known as: ADVIL Take 200 mg by mouth every 6 (six) hours as needed.   losartan 50 MG tablet Commonly known as: COZAAR Take 1 tablet (50 mg total) by mouth daily.   nadolol 40 MG tablet Commonly known as: CORGARD Take 1 tablet (40 mg total) by mouth daily.   Repatha 140 MG/ML Sosy Generic drug: Evolocumab Inject 140 mg into the skin every 14 (fourteen) days.         Objective:   BP 139/83   Pulse 70   Ht 5\' 9"  (1.753 m)   Wt 239 lb (108.4 kg)   SpO2 95%   BMI 35.29 kg/m   Wt Readings from Last 3  Encounters:  01/17/23 239 lb (108.4 kg)  10/15/22 241 lb (109.3 kg)  09/06/22 241 lb (109.3 kg)    Physical Exam Vitals and nursing note reviewed.  Constitutional:      General: He is not in acute distress.    Appearance: He is well-developed. He is not diaphoretic.  Eyes:     General: No scleral icterus.    Conjunctiva/sclera: Conjunctivae normal.  Neck:     Thyroid: No thyromegaly.  Cardiovascular:     Rate and Rhythm: Normal rate and regular rhythm.     Heart sounds: Normal heart sounds. No murmur heard. Pulmonary:     Effort: Pulmonary effort is normal. No respiratory distress.     Breath sounds: Normal breath sounds. No wheezing.  Musculoskeletal:        General: No swelling. Normal range of motion.      Cervical back: Neck supple.  Lymphadenopathy:     Cervical: No cervical adenopathy.  Skin:    General: Skin is warm and dry.     Findings: No rash.  Neurological:     Mental Status: He is alert and oriented to person, place, and time.     Coordination: Coordination normal.  Psychiatric:        Behavior: Behavior normal.       Assessment & Plan:   Problem List Items Addressed This Visit       Cardiovascular and Mediastinum   Essential hypertension   Relevant Medications   amLODipine (NORVASC) 5 MG tablet   Other Relevant Orders   CMP14+EGFR   Lipid panel     Other   HLD (hyperlipidemia) - Primary   Relevant Medications   amLODipine (NORVASC) 5 MG tablet   Other Relevant Orders   CMP14+EGFR   Lipid panel   Hypertriglyceridemia   Relevant Medications   amLODipine (NORVASC) 5 MG tablet   Gout   Relevant Orders   CMP14+EGFR   Lipid panel   Other Visit Diagnoses     Elevated liver function tests       Relevant Orders   CMP14+EGFR     Recheck liver numbers and cholesterol.  He has been focusing on diet Blood pressure looks decent today, no changes there. Follow up plan: Return in about 3 months (around 04/19/2023), or if symptoms worsen or fail to improve, for Hypertension and and liver function and hypertriglyceridemia recheck.  Counseling provided for all of the vaccine components Orders Placed This Encounter  Procedures   CMP14+EGFR   Lipid panel    Arville Care, MD [facility] 01/17/2023, 8:54 AM

## 2023-01-17 NOTE — Addendum Note (Signed)
Addended by: Dorene Sorrow on: 01/17/2023 04:22 PM   Modules accepted: Orders

## 2023-01-18 ENCOUNTER — Other Ambulatory Visit (HOSPITAL_COMMUNITY): Payer: Self-pay

## 2023-01-18 LAB — CMP14+EGFR
ALT: 43 [IU]/L (ref 0–44)
AST: 27 [IU]/L (ref 0–40)
Albumin: 4.6 g/dL (ref 3.8–4.9)
Alkaline Phosphatase: 94 [IU]/L (ref 44–121)
BUN/Creatinine Ratio: 21 — ABNORMAL HIGH (ref 9–20)
BUN: 16 mg/dL (ref 6–24)
Bilirubin Total: 0.5 mg/dL (ref 0.0–1.2)
CO2: 22 mmol/L (ref 20–29)
Calcium: 9.7 mg/dL (ref 8.7–10.2)
Chloride: 104 mmol/L (ref 96–106)
Creatinine, Ser: 0.77 mg/dL (ref 0.76–1.27)
Globulin, Total: 2.7 g/dL (ref 1.5–4.5)
Glucose: 126 mg/dL — ABNORMAL HIGH (ref 70–99)
Potassium: 4.9 mmol/L (ref 3.5–5.2)
Sodium: 142 mmol/L (ref 134–144)
Total Protein: 7.3 g/dL (ref 6.0–8.5)
eGFR: 108 mL/min/{1.73_m2} (ref >59)

## 2023-01-18 LAB — LIPID PANEL
Cholesterol, Total: 263 mg/dL — ABNORMAL HIGH (ref 100–199)
HDL: 56 mg/dL (ref >39)
LDL CALC COMMENT:: 4.7 ratio (ref 0.0–5.0)
LDL Chol Calc (NIH): 182 mg/dL — ABNORMAL HIGH (ref 0–99)
Triglycerides: 136 mg/dL (ref 0–149)
VLDL Cholesterol Cal: 25 mg/dL (ref 5–40)

## 2023-01-20 ENCOUNTER — Other Ambulatory Visit: Payer: Self-pay

## 2023-01-21 ENCOUNTER — Telehealth (INDEPENDENT_AMBULATORY_CARE_PROVIDER_SITE_OTHER): Payer: 59 | Admitting: Family Medicine

## 2023-01-21 ENCOUNTER — Encounter: Payer: Self-pay | Admitting: Family Medicine

## 2023-01-21 DIAGNOSIS — J0100 Acute maxillary sinusitis, unspecified: Secondary | ICD-10-CM | POA: Diagnosis not present

## 2023-01-21 MED ORDER — FLUTICASONE PROPIONATE 50 MCG/ACT NA SUSP
2.0000 | Freq: Every day | NASAL | 6 refills | Status: DC
Start: 2023-01-21 — End: 2023-04-25

## 2023-01-21 MED ORDER — AMOXICILLIN-POT CLAVULANATE 875-125 MG PO TABS
1.0000 | ORAL_TABLET | Freq: Two times a day (BID) | ORAL | 0 refills | Status: AC
Start: 2023-01-21 — End: 2023-01-31

## 2023-01-21 MED ORDER — PREDNISONE 20 MG PO TABS
40.0000 mg | ORAL_TABLET | Freq: Every day | ORAL | 0 refills | Status: AC
Start: 2023-01-21 — End: 2023-01-26

## 2023-01-21 NOTE — Progress Notes (Signed)
Virtual Visit via Video   I connected with patient on 01/21/23 at 1255 by a video enabled telemedicine application and verified that I am speaking with the correct person using two identifiers.  Location patient: Home Location provider: [facility] Persons participating in the virtual visit: Patient and Provider  I discussed the limitations of evaluation and management by telemedicine and the availability of in person appointments. The patient expressed understanding and agreed to proceed.  Subjective:   HPI:  Pt presents today for  Chief Complaint  Patient presents with   Sinus Problem   Discussed the use of AI scribe software for clinical note transcription with the patient, who gave verbal consent to proceed.  History of Present Illness   Michael Hays presents with symptoms of an upper respiratory infection that began a few days ago. Initially, he experienced a sore throat, which has since resolved. However, he currently reports a head cold, runny nose, and general malaise. He denies having a fever but mentions feeling cold, particularly at night.  He has been self-medicating with Coricidin and Nyquil, the latter of which he reports made him feel groggy and unwell the following morning. He describes his symptoms as similar to a severe sinus infection, with discomfort primarily in the nasal area and watery eyes.       ROS per HPI, otherwise unremarkable.    Patient Active Problem List   Diagnosis Date Noted   Hypertriglyceridemia 01/17/2023   Chronic pain of right knee 04/20/2017   Gout 11/10/2015   Transient synovitis of right knee 01/01/2015   LOC OSTEOARTHROS NOT SPEC PRIM/SEC LOWER LEG 01/29/2009   HLD (hyperlipidemia) 11/17/2006   Essential hypertension 11/17/2006   GERD 11/17/2006    Social History   Tobacco Use   Smoking status: Never   Smokeless tobacco: Current    Types: Chew  Substance Use Topics   Alcohol use: Yes     Alcohol/week: 7.0 standard drinks of alcohol    Types: 7 Standard drinks or equivalent per week    Current Outpatient Medications:    amoxicillin-clavulanate (AUGMENTIN) 875-125 MG tablet, Take 1 tablet by mouth 2 (two) times daily for 10 days., Disp: 20 tablet, Rfl: 0   fluticasone (FLONASE) 50 MCG/ACT nasal spray, Place 2 sprays into both nostrils daily., Disp: 16 g, Rfl: 6   predniSONE (DELTASONE) 20 MG tablet, Take 2 tablets (40 mg total) by mouth daily with breakfast for 5 days. 2 po daily for 5 days, Disp: 10 tablet, Rfl: 0   amLODipine (NORVASC) 5 MG tablet, Take 1 tablet (5 mg total) by mouth 2 (two) times daily., Disp: 180 tablet, Rfl: 3   colchicine 0.6 MG tablet, TAKE 1 TABLET BY MOUTH DAILY, Disp: 90 tablet, Rfl: 2   Evolocumab (REPATHA) 140 MG/ML SOSY, Inject 140 mg into the skin every 14 (fourteen) days., Disp: 6 mL, Rfl: 3   ibuprofen (ADVIL) 200 MG tablet, Take 200 mg by mouth every 6 (six) hours as needed., Disp: , Rfl:    losartan (COZAAR) 50 MG tablet, Take 1 tablet (50 mg total) by mouth daily., Disp: 90 tablet, Rfl: 3   nadolol (CORGARD) 40 MG tablet, Take 1 tablet (40 mg total) by mouth daily., Disp: 90 tablet, Rfl: 3  Allergies  Allergen Reactions   Atenolol    Codeine    Hydrocodone-Acetaminophen     Objective:   There were no vitals taken for this visit.  Patient is well-developed, well-nourished in no acute distress.  Resting comfortably at home.  Head is normocephalic, atraumatic.  No labored breathing.  Speech is clear and coherent with logical content.  Patient is alert and oriented at baseline.    Assessment and Plan:   Michael Hays was seen today for sinus problem.  Diagnoses and all orders for this visit:  Acute non-recurrent maxillary sinusitis -     fluticasone (FLONASE) 50 MCG/ACT nasal spray; Place 2 sprays into both nostrils daily. -     amoxicillin-clavulanate (AUGMENTIN) 875-125 MG tablet; Take 1 tablet by mouth 2 (two) times daily for 10  days. -     predniSONE (DELTASONE) 20 MG tablet; Take 2 tablets (40 mg total) by mouth daily with breakfast for 5 days. 2 po daily for 5 days    Assessment and Plan    Upper Respiratory Infection Symptoms of head cold, runny nose, and sinus pressure. No fever. Recent use of Coricidin and Nyquil with limited relief. -Start Flonase and continue for 2 weeks if symptoms improve. -If symptoms do not improve after a few days, start Augmentin. -If symptoms worsen, contact the office.        Return if symptoms worsen or fail to improve.  Kari Baars, FNP-C [facility] [address] [place], [place] [address] [phone]  01/21/2023  Time spent with the patient: 15 minutes, of which >50% was spent in obtaining information about symptoms, reviewing previous labs, evaluations, and treatments, counseling about condition (please see the discussed topics above), and developing a plan to further investigate it; had a number of questions which I addressed.

## 2023-04-15 ENCOUNTER — Other Ambulatory Visit (HOSPITAL_COMMUNITY): Payer: Self-pay

## 2023-04-25 ENCOUNTER — Encounter: Payer: Self-pay | Admitting: Family Medicine

## 2023-04-25 ENCOUNTER — Ambulatory Visit (INDEPENDENT_AMBULATORY_CARE_PROVIDER_SITE_OTHER): Payer: 59 | Admitting: Family Medicine

## 2023-04-25 VITALS — BP 159/86 | HR 73 | Ht 69.0 in | Wt 248.0 lb

## 2023-04-25 DIAGNOSIS — K219 Gastro-esophageal reflux disease without esophagitis: Secondary | ICD-10-CM | POA: Diagnosis not present

## 2023-04-25 DIAGNOSIS — E781 Pure hyperglyceridemia: Secondary | ICD-10-CM | POA: Diagnosis not present

## 2023-04-25 DIAGNOSIS — M10072 Idiopathic gout, left ankle and foot: Secondary | ICD-10-CM

## 2023-04-25 DIAGNOSIS — I10 Essential (primary) hypertension: Secondary | ICD-10-CM | POA: Diagnosis not present

## 2023-04-25 DIAGNOSIS — Z125 Encounter for screening for malignant neoplasm of prostate: Secondary | ICD-10-CM

## 2023-04-25 DIAGNOSIS — M109 Gout, unspecified: Secondary | ICD-10-CM | POA: Diagnosis not present

## 2023-04-25 DIAGNOSIS — E782 Mixed hyperlipidemia: Secondary | ICD-10-CM | POA: Diagnosis not present

## 2023-04-25 DIAGNOSIS — R739 Hyperglycemia, unspecified: Secondary | ICD-10-CM

## 2023-04-25 LAB — BAYER DCA HB A1C WAIVED: HB A1C (BAYER DCA - WAIVED): 5.6 % (ref 4.8–5.6)

## 2023-04-25 MED ORDER — PREDNISONE 20 MG PO TABS: ORAL_TABLET | ORAL | 0 refills | Status: DC

## 2023-04-25 NOTE — Progress Notes (Signed)
BP (!) 159/86   Pulse 73   Ht 5\' 9"  (1.753 m)   Wt 248 lb (112.5 kg)   SpO2 96%   BMI 36.62 kg/m    Subjective:   Patient ID: Michael Hays, male    DOB: 03/24/1971, 53 y.o.   MRN: [id number]  HPI: Michael Hays is a 53 y.o. male presenting on 04/25/2023 for Medical Management of Chronic Issues, Hyperlipidemia, and Hypertension   HPI Hypertension Patient is currently on amlodipine and losartan and nadolol, and their blood pressure today is 166/92. Patient denies any lightheadedness or dizziness. Patient denies headaches, blurred vision, chest pains, shortness of breath, or weakness. Denies any side effects from medication and is content with current medication.   Hyperlipidemia and hypertriglyceridemia Patient is coming in for recheck of his hyperlipidemia. The patient is currently taking Repatha. They deny any issues with myalgias or history of liver damage from it. They deny any focal numbness or weakness or chest pain.   GERD Patient is currently on no medicine currently.  She denies any major symptoms or abdominal pain or belching or burping. She denies any blood in her stool or lightheadedness or dizziness.   Gout Last attack: 2 weeks ago Attacks this year: 5 or 6 Medication: Colchicine and prednisone as needed Location of attacks: Feet  Patient had elevated blood sugar on his last couple draws, will check an A1c at this time.  Relevant past medical, surgical, family and social history reviewed and updated as indicated. Interim medical history since our last visit reviewed. Allergies and medications reviewed and updated.  Review of Systems  Constitutional:  Negative for chills and fever.  HENT:  Negative for ear pain and tinnitus.   Eyes:  Negative for pain and visual disturbance.  Respiratory:  Negative for cough, shortness of breath and wheezing.   Cardiovascular:  Negative for chest pain, palpitations and leg swelling.  Gastrointestinal:  Negative for abdominal  pain, blood in stool, constipation and diarrhea.  Genitourinary:  Negative for dysuria and hematuria.  Musculoskeletal:  Positive for arthralgias and joint swelling. Negative for back pain, gait problem and myalgias.  Skin:  Negative for rash.  Neurological:  Negative for dizziness, weakness and headaches.  Psychiatric/Behavioral:  Negative for suicidal ideas.   All other systems reviewed and are negative.   Per HPI unless specifically indicated above   Allergies as of 04/25/2023       Reactions   Atenolol    Codeine    Hydrocodone-acetaminophen         Medication List        Accurate as of April 25, 2023  8:54 AM. If you have any questions, ask your nurse or doctor.          STOP taking these medications    fluticasone 50 MCG/ACT nasal spray Commonly known as: FLONASE Stopped by: [name] Connor Meacham       TAKE these medications    amLODipine 5 MG tablet Commonly known as: NORVASC Take 1 tablet (5 mg total) by mouth 2 (two) times daily.   colchicine 0.6 MG tablet TAKE 1 TABLET BY MOUTH DAILY   ibuprofen 200 MG tablet Commonly known as: ADVIL Take 200 mg by mouth every 6 (six) hours as needed.   losartan 50 MG tablet Commonly known as: COZAAR Take 1 tablet (50 mg total) by mouth daily.   nadolol 40 MG tablet Commonly known as: CORGARD Take 1 tablet (40 mg total) by mouth daily.  predniSONE 20 MG tablet Commonly known as: DELTASONE Take 3 tabs daily for 1 week, then 2 tabs daily for week 2, then 1 tab daily for week 3. Started by: [name] [name]   Repatha 140 MG/ML Sosy Generic drug: Evolocumab Inject 140 mg into the skin every 14 (fourteen) days.         Objective:   BP (!) 159/86   Pulse 73   Ht 5\' 9"  (1.753 m)   Wt 248 lb (112.5 kg)   SpO2 96%   BMI 36.62 kg/m   Wt Readings from Last 3 Encounters:  04/25/23 248 lb (112.5 kg)  01/17/23 239 lb (108.4 kg)  10/15/22 241 lb (109.3 kg)    Physical Exam Vitals and nursing note  reviewed.  Constitutional:      General: He is not in acute distress.    Appearance: He is well-developed. He is not diaphoretic.  Eyes:     General: No scleral icterus.    Conjunctiva/sclera: Conjunctivae normal.  Neck:     Thyroid: No thyromegaly.  Cardiovascular:     Rate and Rhythm: Normal rate and regular rhythm.     Heart sounds: Normal heart sounds. No murmur heard. Pulmonary:     Effort: Pulmonary effort is normal. No respiratory distress.     Breath sounds: Normal breath sounds. No wheezing.  Musculoskeletal:        General: No swelling. Normal range of motion.     Cervical back: Neck supple.  Lymphadenopathy:     Cervical: No cervical adenopathy.  Skin:    General: Skin is warm and dry.     Findings: No rash.  Neurological:     Mental Status: He is alert and oriented to person, place, and time.     Coordination: Coordination normal.  Psychiatric:        Behavior: Behavior normal.       Assessment & Plan:   Problem List Items Addressed This Visit       Cardiovascular and Mediastinum   Essential hypertension   Relevant Orders   Bayer DCA Hb A1c Waived     Digestive   GERD   Relevant Orders   CBC with Differential/Platelet     Other   HLD (hyperlipidemia)   Relevant Orders   Bayer DCA Hb A1c Waived   Lipid panel   CMP14+EGFR   Hypertriglyceridemia - Primary   Relevant Orders   Bayer DCA Hb A1c Waived   Gout   Relevant Medications   predniSONE (DELTASONE) 20 MG tablet   Other Visit Diagnoses       Prostate cancer screening       Relevant Orders   PSA, total and free     Elevated blood sugar       Relevant Orders   Bayer DCA Hb A1c Waived       No current flare for gout but keeps colchicine and a little bit of prednisone on hand that he uses sometimes.  Has been intolerant of allopurinol and Uloric in the past  Elevated blood sugar in the past, will check A1c.  Blood pressure elevated today recheck blood pressure still slightly  elevated.  Patient is going to check his blood pressures for the next 2 weeks every day for normal and let us know how he is running.  He is also going to avoid some salt especially after the holidays that may be why his blood pressure is up Follow up plan: Return in about 3 months (  around 07/24/2023), or if symptoms worsen or fail to improve, for Hypertension and hyperlipidemia and hypertriglyceridemia.  Counseling provided for all of the vaccine components Orders Placed This Encounter  Procedures   Bayer DCA Hb A1c Waived   Lipid panel   CMP14+EGFR   CBC with Differential/Platelet   PSA, total and free    Arville Care, MD [place] [facility] 04/25/2023, 8:54 AM

## 2023-04-26 LAB — CMP14+EGFR
ALT: 39 [IU]/L (ref 0–44)
AST: 26 [IU]/L (ref 0–40)
Albumin: 4.6 g/dL (ref 3.8–4.9)
Alkaline Phosphatase: 84 [IU]/L (ref 44–121)
BUN/Creatinine Ratio: 17 (ref 9–20)
BUN: 14 mg/dL (ref 6–24)
Bilirubin Total: 0.5 mg/dL (ref 0.0–1.2)
CO2: 24 mmol/L (ref 20–29)
Calcium: 9.8 mg/dL (ref 8.7–10.2)
Chloride: 102 mmol/L (ref 96–106)
Creatinine, Ser: 0.83 mg/dL (ref 0.76–1.27)
Globulin, Total: 2.6 g/dL (ref 1.5–4.5)
Glucose: 104 mg/dL — ABNORMAL HIGH (ref 70–99)
Potassium: 5.2 mmol/L (ref 3.5–5.2)
Sodium: 141 mmol/L (ref 134–144)
Total Protein: 7.2 g/dL (ref 6.0–8.5)
eGFR: 105 mL/min/{1.73_m2} (ref >59)

## 2023-04-26 LAB — LIPID PANEL
Chol/HDL Ratio: 2.5 {ratio} (ref 0.0–5.0)
Cholesterol, Total: 216 mg/dL — ABNORMAL HIGH (ref 100–199)
HDL: 85 mg/dL (ref >39)
LDL Chol Calc (NIH): 114 mg/dL — ABNORMAL HIGH (ref 0–99)
Triglycerides: 99 mg/dL (ref 0–149)
VLDL Cholesterol Cal: 17 mg/dL (ref 5–40)

## 2023-04-26 LAB — CBC WITH DIFFERENTIAL/PLATELET
Basophils Absolute: 0.1 10*3/uL (ref 0.0–0.2)
Basos: 2 %
EOS (ABSOLUTE): 0.1 10*3/uL (ref 0.0–0.4)
Eos: 2 %
Hematocrit: 45.4 % (ref 37.5–51.0)
Hemoglobin: 15.4 g/dL (ref 13.0–17.7)
Immature Grans (Abs): 0 10*3/uL (ref 0.0–0.1)
Immature Granulocytes: 1 %
Lymphocytes Absolute: 1.5 10*3/uL (ref 0.7–3.1)
Lymphs: 24 %
MCH: 30.3 pg (ref 26.6–33.0)
MCHC: 33.9 g/dL (ref 31.5–35.7)
MCV: 89 fL (ref 79–97)
Monocytes Absolute: 0.9 10*3/uL (ref 0.1–0.9)
Monocytes: 14 %
Neutrophils Absolute: 3.6 10*3/uL (ref 1.4–7.0)
Neutrophils: 57 %
Platelets: 268 10*3/uL (ref 150–450)
RBC: 5.08 x10E6/uL (ref 4.14–5.80)
RDW: 12.5 % (ref 11.6–15.4)
WBC: 6.1 10*3/uL (ref 3.4–10.8)

## 2023-04-26 LAB — PSA, TOTAL AND FREE
PSA, Free Pct: 22.5 %
PSA, Free: 0.36 ng/mL
Prostate Specific Ag, Serum: 1.6 ng/mL (ref 0.0–4.0)

## 2023-04-29 ENCOUNTER — Encounter: Payer: Self-pay | Admitting: Family Medicine

## 2023-04-29 ENCOUNTER — Other Ambulatory Visit: Payer: Self-pay

## 2023-04-29 DIAGNOSIS — Z8739 Personal history of other diseases of the musculoskeletal system and connective tissue: Secondary | ICD-10-CM

## 2023-05-03 ENCOUNTER — Other Ambulatory Visit (HOSPITAL_COMMUNITY): Payer: Self-pay

## 2023-05-03 LAB — SPECIMEN STATUS REPORT

## 2023-05-03 LAB — URIC ACID: Uric Acid: 8 mg/dL (ref 3.8–8.4)

## 2023-07-04 ENCOUNTER — Other Ambulatory Visit (HOSPITAL_COMMUNITY): Payer: Self-pay

## 2023-07-12 ENCOUNTER — Other Ambulatory Visit (HOSPITAL_COMMUNITY): Payer: Self-pay

## 2023-07-14 ENCOUNTER — Encounter: Payer: Self-pay | Admitting: Family Medicine

## 2023-07-14 ENCOUNTER — Ambulatory Visit: Admitting: Family Medicine

## 2023-07-14 VITALS — BP 162/95 | HR 96 | Temp 98.2°F | Ht 69.0 in | Wt 236.0 lb

## 2023-07-14 DIAGNOSIS — M7052 Other bursitis of knee, left knee: Secondary | ICD-10-CM | POA: Diagnosis not present

## 2023-07-14 MED ORDER — PREDNISONE 20 MG PO TABS
ORAL_TABLET | ORAL | 0 refills | Status: DC
Start: 2023-07-14 — End: 2024-01-13

## 2023-07-14 MED ORDER — METHYLPREDNISOLONE ACETATE 80 MG/ML IJ SUSP
80.0000 mg | Freq: Once | INTRAMUSCULAR | Status: AC
  Administered 2023-07-14: 80 mg via INTRAMUSCULAR

## 2023-07-14 NOTE — Progress Notes (Signed)
 BP (!) 162/95   Pulse 96   Temp 98.2 F (36.8 C)   Ht 5\' 9"  (1.753 m)   Wt 236 lb (107 kg)   SpO2 95%   BMI 34.85 kg/m    Subjective:   Patient ID: Michael Hays, male    DOB: Nov 15, 1970, 53 y.o.   MRN: [id number]  HPI: Michael Hays is a 53 y.o. male presenting on 07/14/2023 for Knee Pain (left)   HPI Left anterior knee pain Patient is coming in today complaining of left anterior knee pain.  He says it has been bothering him over the past week.  He does state he has been working and doing some stuff around the house that has been on his knees more often, he did try and use a pad some of the time but not all the time.  He says it does hurt him when he first gets up and moving and it hurts when he is pushes on it but not as much when he is walking or standing on it.  Relevant past medical, surgical, family and social history reviewed and updated as indicated. Interim medical history since our last visit reviewed. Allergies and medications reviewed and updated.  Review of Systems  Constitutional:  Negative for chills and fever.  Eyes:  Negative for visual disturbance.  Respiratory:  Negative for shortness of breath and wheezing.   Cardiovascular:  Negative for chest pain and leg swelling.  Musculoskeletal:  Positive for arthralgias and joint swelling. Negative for back pain and gait problem.  Skin:  Negative for rash.  Neurological:  Negative for dizziness and light-headedness.  All other systems reviewed and are negative.   Per HPI unless specifically indicated above   Allergies as of 07/14/2023       Reactions   Atenolol    Codeine    Hydrocodone-acetaminophen         Medication List        Accurate as of July 14, 2023  2:41 PM. If you have any questions, ask your nurse or doctor.          amLODipine 5 MG tablet Commonly known as: NORVASC Take 1 tablet (5 mg total) by mouth 2 (two) times daily.   colchicine 0.6 MG tablet TAKE 1 TABLET BY MOUTH DAILY    ibuprofen 200 MG tablet Commonly known as: ADVIL Take 200 mg by mouth every 6 (six) hours as needed.   losartan 50 MG tablet Commonly known as: COZAAR Take 1 tablet (50 mg total) by mouth daily.   nadolol 40 MG tablet Commonly known as: CORGARD Take 1 tablet (40 mg total) by mouth daily.   predniSONE 20 MG tablet Commonly known as: DELTASONE Take 3 tabs daily for 1 week, then 2 tabs daily for week 2, then 1 tab daily for week 3.   Repatha 140 MG/ML Sosy Generic drug: Evolocumab Inject 140 mg into the skin every 14 (fourteen) days.         Objective:   BP (!) 162/95   Pulse 96   Temp 98.2 F (36.8 C)   Ht 5\' 9"  (1.753 m)   Wt 236 lb (107 kg)   SpO2 95%   BMI 34.85 kg/m   Wt Readings from Last 3 Encounters:  07/14/23 236 lb (107 kg)  04/25/23 248 lb (112.5 kg)  01/17/23 239 lb (108.4 kg)    Physical Exam Vitals and nursing note reviewed.  Constitutional:      Appearance: Normal  appearance.  Musculoskeletal:     Left knee: No crepitus. Tenderness present. Normal alignment, normal meniscus and normal patellar mobility.       Legs:  Neurological:     Mental Status: He is alert.       Assessment & Plan:   Problem List Items Addressed This Visit   None Visit Diagnoses       Infrapatellar bursitis of left knee    -  Primary   Relevant Medications   methylPREDNISolone acetate (DEPO-MEDROL) injection 80 mg (Start on 07/14/2023  2:45 PM)   predniSONE (DELTASONE) 20 MG tablet     Instructed patient that he needs to either wear pads or be off of that knee, and otherwise is not kneeling on it until it calms down.  Did give a Depo-Medrol injection intramuscular today and some oral prednisone to take if not improving.  Follow up plan: Return if symptoms worsen or fail to improve.  Counseling provided for all of the vaccine components No orders of the defined types were placed in this encounter.   Arville Care, MD [facility] 07/14/2023, 2:41 PM

## 2023-08-03 ENCOUNTER — Other Ambulatory Visit (HOSPITAL_COMMUNITY): Payer: Self-pay

## 2023-08-03 ENCOUNTER — Ambulatory Visit (INDEPENDENT_AMBULATORY_CARE_PROVIDER_SITE_OTHER): Payer: 59 | Admitting: Family Medicine

## 2023-08-03 ENCOUNTER — Encounter: Payer: Self-pay | Admitting: Family Medicine

## 2023-08-03 VITALS — BP 138/70 | HR 69 | Temp 98.0°F | Ht 69.0 in | Wt 231.0 lb

## 2023-08-03 DIAGNOSIS — E782 Mixed hyperlipidemia: Secondary | ICD-10-CM | POA: Diagnosis not present

## 2023-08-03 DIAGNOSIS — Z9189 Other specified personal risk factors, not elsewhere classified: Secondary | ICD-10-CM | POA: Diagnosis not present

## 2023-08-03 DIAGNOSIS — M10072 Idiopathic gout, left ankle and foot: Secondary | ICD-10-CM | POA: Diagnosis not present

## 2023-08-03 DIAGNOSIS — I10 Essential (primary) hypertension: Secondary | ICD-10-CM

## 2023-08-03 DIAGNOSIS — E781 Pure hyperglyceridemia: Secondary | ICD-10-CM

## 2023-08-03 MED ORDER — REPATHA 140 MG/ML ~~LOC~~ SOSY
140.0000 mg | PREFILLED_SYRINGE | SUBCUTANEOUS | 3 refills | Status: DC
  Filled 2023-08-03 – 2023-10-24 (×2): qty 6, 84d supply, fill #0
  Filled 2024-01-22: qty 6, 84d supply, fill #1
  Filled 2024-04-29: qty 6, 84d supply, fill #2

## 2023-08-03 MED ORDER — REPATHA 140 MG/ML ~~LOC~~ SOSY: 140.0000 mg | PREFILLED_SYRINGE | SUBCUTANEOUS | 3 refills | Status: DC

## 2023-08-03 NOTE — Progress Notes (Signed)
 BP 138/70   Pulse 69   Temp 98 F (36.7 C)   Ht 5\' 9"  (1.753 m)   Wt 231 lb (104.8 kg)   SpO2 96%   BMI 34.11 kg/m    Subjective:   Patient ID: Michael Hays, male    DOB: Apr 16, 1970, 53 y.o.   MRN: [id number]  HPI: Michael Hays is a 52 y.o. male presenting on 08/03/2023 for Medical Management of Chronic Issues, Hyperlipidemia, and Hypertension   HPI Hypertension Patient is currently on losartan  and nadolol  and amlodipine , and their blood pressure today is 138/70. Patient denies any lightheadedness or dizziness. Patient denies headaches, blurred vision, chest pains, shortness of breath, or weakness. Denies any side effects from medication and is content with current medication.   Hyperlipidemia and hypertriglyceridemia recheck Patient is coming in for recheck of his hyperlipidemia. The patient is currently taking Repatha . They deny any issues with myalgias or history of liver damage from it. They deny any focal numbness or weakness or chest pain.   Gout Last attack: Months ago Attacks this year: 1 or 2 Medication: Colchicine  as needed Location of attacks: Feet and sometimes knees  Relevant past medical, surgical, family and social history reviewed and updated as indicated. Interim medical history since our last visit reviewed. Allergies and medications reviewed and updated.  Review of Systems  Constitutional:  Negative for chills and fever.  Respiratory:  Negative for shortness of breath and wheezing.   Cardiovascular:  Negative for chest pain and leg swelling.  Musculoskeletal:  Negative for back pain and gait problem.  Skin:  Negative for rash.  Neurological:  Negative for dizziness and light-headedness.  All other systems reviewed and are negative.   Per HPI unless specifically indicated above   Allergies as of 08/03/2023       Reactions   Atenolol    Codeine    Hydrocodone-acetaminophen          Medication List        Accurate as of August 03, 2023  11:09 AM. If you have any questions, ask your nurse or doctor.          amLODipine  5 MG tablet Commonly known as: NORVASC  Take 1 tablet (5 mg total) by mouth 2 (two) times daily.   colchicine  0.6 MG tablet TAKE 1 TABLET BY MOUTH DAILY   ibuprofen 200 MG tablet Commonly known as: ADVIL Take 200 mg by mouth every 6 (six) hours as needed.   losartan  50 MG tablet Commonly known as: COZAAR  Take 1 tablet (50 mg total) by mouth daily.   nadolol  40 MG tablet Commonly known as: CORGARD  Take 1 tablet (40 mg total) by mouth daily.   predniSONE  20 MG tablet Commonly known as: DELTASONE  Take 3 tabs daily for 1 week, then 2 tabs daily for week 2, then 1 tab daily for week 3.   Repatha  140 MG/ML Sosy Generic drug: Evolocumab  Inject 140 mg into the skin every 14 (fourteen) days.         Objective:   BP 138/70   Pulse 69   Temp 98 F (36.7 C)   Ht 5\' 9"  (1.753 m)   Wt 231 lb (104.8 kg)   SpO2 96%   BMI 34.11 kg/m   Wt Readings from Last 3 Encounters:  08/03/23 231 lb (104.8 kg)  07/14/23 236 lb (107 kg)  04/25/23 248 lb (112.5 kg)    Physical Exam Vitals and nursing note reviewed.  Constitutional:  General: He is not in acute distress.    Appearance: He is well-developed. He is not diaphoretic.  Eyes:     General: No scleral icterus.    Conjunctiva/sclera: Conjunctivae normal.  Neck:     Thyroid : No thyromegaly.  Cardiovascular:     Rate and Rhythm: Normal rate and regular rhythm.     Heart sounds: Normal heart sounds. No murmur heard. Pulmonary:     Effort: Pulmonary effort is normal. No respiratory distress.     Breath sounds: Normal breath sounds. No wheezing.  Musculoskeletal:        General: No swelling. Normal range of motion.     Cervical back: Neck supple.  Lymphadenopathy:     Cervical: No cervical adenopathy.  Skin:    General: Skin is warm and dry.     Findings: No rash.  Neurological:     Mental Status: He is alert and oriented to person,  place, and time.     Coordination: Coordination normal.  Psychiatric:        Behavior: Behavior normal.       Assessment & Plan:   Problem List Items Addressed This Visit       Cardiovascular and Mediastinum   Essential hypertension - Primary   Relevant Medications   Evolocumab  (REPATHA ) 140 MG/ML SOSY   Other Relevant Orders   CMP14+EGFR   Lipid panel     Other   HLD (hyperlipidemia)   Relevant Medications   Evolocumab  (REPATHA ) 140 MG/ML SOSY   Hypertriglyceridemia   Relevant Medications   Evolocumab  (REPATHA ) 140 MG/ML SOSY   Other Relevant Orders   CMP14+EGFR   Lipid panel   Gout   Other Visit Diagnoses       10 year risk of MI or stroke 7.5% or greater       Relevant Medications   Evolocumab  (REPATHA ) 140 MG/ML SOSY       Continue current medicine, will check blood work today.  Blood pressure looks good today.  No changes Follow up plan: Return in about 3 months (around 11/02/2023), or if symptoms worsen or fail to improve, for Hypertension and hyperlipidemia and hypertriglyceridemia.  Counseling provided for all of the vaccine components Orders Placed This Encounter  Procedures   CMP14+EGFR   Lipid panel    Jolyne Needs, MD [facility] 08/03/2023, 11:09 AM

## 2023-08-03 NOTE — Addendum Note (Signed)
 Addended by: Francine Iron on: 08/03/2023 01:24 PM   Modules accepted: Orders

## 2023-08-04 LAB — CMP14+EGFR
ALT: 54 IU/L — ABNORMAL HIGH (ref 0–44)
AST: 37 IU/L (ref 0–40)
Albumin: 4.3 g/dL (ref 3.8–4.9)
Alkaline Phosphatase: 93 IU/L (ref 44–121)
BUN/Creatinine Ratio: 23 — ABNORMAL HIGH (ref 9–20)
BUN: 16 mg/dL (ref 6–24)
Bilirubin Total: 0.5 mg/dL (ref 0.0–1.2)
CO2: 22 mmol/L (ref 20–29)
Calcium: 9.5 mg/dL (ref 8.7–10.2)
Chloride: 104 mmol/L (ref 96–106)
Creatinine, Ser: 0.7 mg/dL — ABNORMAL LOW (ref 0.76–1.27)
Globulin, Total: 2.6 g/dL (ref 1.5–4.5)
Glucose: 109 mg/dL — ABNORMAL HIGH (ref 70–99)
Potassium: 4.3 mmol/L (ref 3.5–5.2)
Sodium: 140 mmol/L (ref 134–144)
Total Protein: 6.9 g/dL (ref 6.0–8.5)
eGFR: 111 mL/min/{1.73_m2} (ref >59)

## 2023-08-04 LAB — LIPID PANEL
Chol/HDL Ratio: 2.4 ratio (ref 0.0–5.0)
Cholesterol, Total: 151 mg/dL (ref 100–199)
HDL: 64 mg/dL (ref >39)
LDL Chol Calc (NIH): 70 mg/dL (ref 0–99)
Triglycerides: 94 mg/dL (ref 0–149)
VLDL Cholesterol Cal: 17 mg/dL (ref 5–40)

## 2023-08-09 ENCOUNTER — Other Ambulatory Visit (HOSPITAL_COMMUNITY): Payer: Self-pay

## 2023-08-09 ENCOUNTER — Other Ambulatory Visit: Payer: Self-pay | Admitting: Family Medicine

## 2023-08-09 DIAGNOSIS — I10 Essential (primary) hypertension: Secondary | ICD-10-CM

## 2023-08-09 MED ORDER — AMLODIPINE BESYLATE 5 MG PO TABS
5.0000 mg | ORAL_TABLET | Freq: Two times a day (BID) | ORAL | 3 refills | Status: AC
  Filled 2023-08-09: qty 180, 90d supply, fill #0
  Filled 2024-01-23: qty 180, 90d supply, fill #1

## 2023-08-10 ENCOUNTER — Encounter: Payer: Self-pay | Admitting: Family Medicine

## 2023-08-24 ENCOUNTER — Ambulatory Visit: Admitting: Family Medicine

## 2023-08-24 ENCOUNTER — Ambulatory Visit (INDEPENDENT_AMBULATORY_CARE_PROVIDER_SITE_OTHER)

## 2023-08-24 ENCOUNTER — Ambulatory Visit (HOSPITAL_COMMUNITY)
Admission: RE | Admit: 2023-08-24 | Discharge: 2023-08-24 | Disposition: A | Discharge status: Home / Self Care | Source: Ambulatory Visit | Attending: Family Medicine | Admitting: Family Medicine

## 2023-08-24 ENCOUNTER — Encounter: Payer: Self-pay | Admitting: Family Medicine

## 2023-08-24 VITALS — BP 125/73 | HR 84 | Temp 98.0°F | Ht 69.0 in | Wt 233.0 lb

## 2023-08-24 DIAGNOSIS — L03116 Cellulitis of left lower limb: Secondary | ICD-10-CM

## 2023-08-24 DIAGNOSIS — M19072 Primary osteoarthritis, left ankle and foot: Secondary | ICD-10-CM | POA: Diagnosis not present

## 2023-08-24 DIAGNOSIS — R600 Localized edema: Secondary | ICD-10-CM | POA: Insufficient documentation

## 2023-08-24 DIAGNOSIS — M25572 Pain in left ankle and joints of left foot: Secondary | ICD-10-CM

## 2023-08-24 DIAGNOSIS — M7989 Other specified soft tissue disorders: Secondary | ICD-10-CM | POA: Diagnosis not present

## 2023-08-24 DIAGNOSIS — M10072 Idiopathic gout, left ankle and foot: Secondary | ICD-10-CM | POA: Diagnosis not present

## 2023-08-24 DIAGNOSIS — M7732 Calcaneal spur, left foot: Secondary | ICD-10-CM | POA: Diagnosis not present

## 2023-08-24 MED ORDER — SULFAMETHOXAZOLE-TRIMETHOPRIM 800-160 MG PO TABS: 1.0000 | ORAL_TABLET | Freq: Two times a day (BID) | ORAL | 0 refills | Status: DC

## 2023-08-24 MED ORDER — PREDNISONE 20 MG PO TABS: ORAL_TABLET | ORAL | 0 refills | Status: DC

## 2023-08-24 NOTE — Progress Notes (Signed)
 BP 125/73   Pulse 84   Temp 98 F (36.7 C)   Ht 5\' 9"  (1.753 m)   Wt 233 lb (105.7 kg)   SpO2 96%   BMI 34.41 kg/m    Subjective:   Patient ID: Michael Hays, male    DOB: 05/05/1970, 53 y.o.   MRN: [id number]  HPI: [name] is a 53 y.o. male presenting on 08/24/2023 for Leg Swelling (LLE. Red, painful at ankle. Started Monday pm.)   HPI Patient is coming in today with complaints of left ankle pain and left lower extremity swelling and pain.  He also has some redness and warmth that is noticed.  That he started to notice this over the past couple weeks.  He says the pains, more recently but he did have some swelling 4 weeks ago.  The redness and warmth and it seems to have developed more recently.  He did try some prednisone  initially 4 weeks ago and the swelling did go down some but then its come back.  Relevant past medical, surgical, family and social history reviewed and updated as indicated. Interim medical history since our last visit reviewed. Allergies and medications reviewed and updated.  Review of Systems  Constitutional:  Negative for chills and fever.  Eyes:  Negative for visual disturbance.  Respiratory:  Negative for shortness of breath and wheezing.   Cardiovascular:  Negative for chest pain and leg swelling.  Musculoskeletal:  Negative for back pain and gait problem.  Skin:  Negative for rash.  Neurological:  Negative for dizziness and light-headedness.  All other systems reviewed and are negative.   Per HPI unless specifically indicated above   Allergies as of 08/24/2023       Reactions   Atenolol    Codeine    Hydrocodone-acetaminophen          Medication List        Accurate as of Aug 24, 2023  3:29 PM. If you have any questions, ask your nurse or doctor.          amLODipine  5 MG tablet Commonly known as: NORVASC  Take 1 tablet (5 mg total) by mouth 2 (two) times daily.   colchicine  0.6 MG tablet TAKE 1 TABLET BY MOUTH DAILY    ibuprofen 200 MG tablet Commonly known as: ADVIL Take 200 mg by mouth every 6 (six) hours as needed.   losartan  50 MG tablet Commonly known as: COZAAR  Take 1 tablet (50 mg total) by mouth daily.   nadolol  40 MG tablet Commonly known as: CORGARD  Take 1 tablet (40 mg total) by mouth daily.   predniSONE  20 MG tablet Commonly known as: DELTASONE  Take 3 tabs daily for 1 week, then 2 tabs daily for week 2, then 1 tab daily for week 3. What changed: Another medication with the same name was added. Make sure you understand how and when to take each. Changed by: Lucio Sabin Loray Akard   predniSONE  20 MG tablet Commonly known as: DELTASONE  2 po at same time daily for 5 days What changed: You were already taking a medication with the same name, and this prescription was added. Make sure you understand how and when to take each. Changed by: Lucio Sabin Damesha Lawler   Repatha  140 MG/ML Sosy Generic drug: Evolocumab  Inject 140 mg into the skin every 14 (fourteen) days.   sulfamethoxazole-trimethoprim 800-160 MG tablet Commonly known as: BACTRIM DS Take 1 tablet by mouth 2 (two) times daily. Started by: Lucio Sabin Anh Bigos  Objective:   BP 125/73   Pulse 84   Temp 98 F (36.7 C)   Ht 5\' 9"  (1.753 m)   Wt 233 lb (105.7 kg)   SpO2 96%   BMI 34.41 kg/m   Wt Readings from Last 3 Encounters:  08/24/23 233 lb (105.7 kg)  08/03/23 231 lb (104.8 kg)  07/14/23 236 lb (107 kg)    Physical Exam Vitals and nursing note reviewed.  Constitutional:      General: He is not in acute distress.    Appearance: He is well-developed. He is not diaphoretic.  Eyes:     General: No scleral icterus.    Conjunctiva/sclera: Conjunctivae normal.  Neck:     Thyroid : No thyromegaly.  Musculoskeletal:        General: Swelling present. Normal range of motion.     Left lower leg: Swelling present. 2+ Pitting Edema present.     Left ankle: Swelling present. Tenderness present over the lateral  malleolus, medial malleolus and ATF ligament.     Left Achilles Tendon: Normal.     Comments: Slight redness to left lower extremity compared to the other extremity and slight warmth to it as well.  Skin:    General: Skin is warm and dry.     Findings: No rash.  Neurological:     Mental Status: He is alert and oriented to person, place, and time.     Coordination: Coordination normal.  Psychiatric:        Behavior: Behavior normal.    Left ankle x-ray: No acute bony abnormalities, await final read from radiology.  Assessment & Plan:   Problem List Items Addressed This Visit       Other   Gout   Relevant Medications   sulfamethoxazole-trimethoprim (BACTRIM DS) 800-160 MG tablet   predniSONE  (DELTASONE ) 20 MG tablet   Other Relevant Orders   CMP14+EGFR   CBC with Differential/Platelet   TSH   Other Visit Diagnoses       Acute left ankle pain    -  Primary   Relevant Medications   sulfamethoxazole-trimethoprim (BACTRIM DS) 800-160 MG tablet   predniSONE  (DELTASONE ) 20 MG tablet   Other Relevant Orders   DG Ankle Complete Left   CMP14+EGFR   CBC with Differential/Platelet   TSH     Edema of left lower extremity       Relevant Medications   sulfamethoxazole-trimethoprim (BACTRIM DS) 800-160 MG tablet   predniSONE  (DELTASONE ) 20 MG tablet   Other Relevant Orders   CMP14+EGFR   CBC with Differential/Platelet   TSH   US  Venous Img Lower Unilateral Left     Cellulitis of left lower extremity       Relevant Medications   sulfamethoxazole-trimethoprim (BACTRIM DS) 800-160 MG tablet   predniSONE  (DELTASONE ) 20 MG tablet   Other Relevant Orders   CMP14+EGFR   CBC with Differential/Platelet   TSH     Swelling and redness or warmth of left lower leg.  There is some concerns with the pain that he has gout in his ankle but that the swelling goes all the way up his legs going to rule out DVT.  Ankle x-ray looks fine.  Will also treat like possible cellulitis and given  Bactrim and also given the prednisone .  Follow up plan: Return if symptoms worsen or fail to improve.  Counseling provided for all of the vaccine components Orders Placed This Encounter  Procedures   DG Ankle Complete Left   US  Venous  Img Lower Unilateral Left   CMP14+EGFR   CBC with Differential/Platelet   TSH    Jolyne Needs, MD [facility] 08/24/2023, 3:29 PM

## 2023-08-25 ENCOUNTER — Ambulatory Visit: Payer: Self-pay | Admitting: Family Medicine

## 2023-08-25 LAB — TSH: TSH: 1.9 u[IU]/mL (ref 0.450–4.500)

## 2023-08-25 LAB — CMP14+EGFR
ALT: 29 IU/L (ref 0–44)
AST: 25 IU/L (ref 0–40)
Albumin: 4.3 g/dL (ref 3.8–4.9)
Alkaline Phosphatase: 92 IU/L (ref 44–121)
BUN/Creatinine Ratio: 21 — ABNORMAL HIGH (ref 9–20)
BUN: 21 mg/dL (ref 6–24)
Bilirubin Total: 0.4 mg/dL (ref 0.0–1.2)
CO2: 19 mmol/L — ABNORMAL LOW (ref 20–29)
Calcium: 9.6 mg/dL (ref 8.7–10.2)
Chloride: 102 mmol/L (ref 96–106)
Creatinine, Ser: 1 mg/dL (ref 0.76–1.27)
Globulin, Total: 2.7 g/dL (ref 1.5–4.5)
Glucose: 98 mg/dL (ref 70–99)
Potassium: 4.5 mmol/L (ref 3.5–5.2)
Sodium: 140 mmol/L (ref 134–144)
Total Protein: 7 g/dL (ref 6.0–8.5)
eGFR: 91 mL/min/{1.73_m2} (ref >59)

## 2023-08-25 LAB — CBC WITH DIFFERENTIAL/PLATELET
Basophils Absolute: 0.1 10*3/uL (ref 0.0–0.2)
Basos: 1 %
EOS (ABSOLUTE): 0.1 10*3/uL (ref 0.0–0.4)
Eos: 1 %
Hematocrit: 42.6 % (ref 37.5–51.0)
Hemoglobin: 14.2 g/dL (ref 13.0–17.7)
Immature Grans (Abs): 0.1 10*3/uL (ref 0.0–0.1)
Immature Granulocytes: 1 %
Lymphocytes Absolute: 1.4 10*3/uL (ref 0.7–3.1)
Lymphs: 15 %
MCH: 29.5 pg (ref 26.6–33.0)
MCHC: 33.3 g/dL (ref 31.5–35.7)
MCV: 89 fL (ref 79–97)
Monocytes Absolute: 1.2 10*3/uL — ABNORMAL HIGH (ref 0.1–0.9)
Monocytes: 13 %
Neutrophils Absolute: 6.5 10*3/uL (ref 1.4–7.0)
Neutrophils: 69 %
Platelets: 255 10*3/uL (ref 150–450)
RBC: 4.81 x10E6/uL (ref 4.14–5.80)
RDW: 12.8 % (ref 11.6–15.4)
WBC: 9.4 10*3/uL (ref 3.4–10.8)

## 2023-09-12 ENCOUNTER — Other Ambulatory Visit (HOSPITAL_COMMUNITY): Payer: Self-pay

## 2023-10-10 ENCOUNTER — Other Ambulatory Visit (HOSPITAL_COMMUNITY): Payer: Self-pay

## 2023-10-25 ENCOUNTER — Other Ambulatory Visit (HOSPITAL_COMMUNITY): Payer: Self-pay

## 2023-10-27 ENCOUNTER — Ambulatory Visit: Admitting: Family Medicine

## 2023-10-27 ENCOUNTER — Other Ambulatory Visit (HOSPITAL_COMMUNITY): Payer: Self-pay

## 2023-10-27 ENCOUNTER — Encounter: Payer: Self-pay | Admitting: Family Medicine

## 2023-10-27 VITALS — BP 138/87 | HR 64 | Ht 69.0 in | Wt 235.0 lb

## 2023-10-27 DIAGNOSIS — M1A0720 Idiopathic chronic gout, left ankle and foot, without tophus (tophi): Secondary | ICD-10-CM | POA: Diagnosis not present

## 2023-10-27 DIAGNOSIS — E782 Mixed hyperlipidemia: Secondary | ICD-10-CM

## 2023-10-27 DIAGNOSIS — I10 Essential (primary) hypertension: Secondary | ICD-10-CM

## 2023-10-27 DIAGNOSIS — E781 Pure hyperglyceridemia: Secondary | ICD-10-CM

## 2023-10-27 DIAGNOSIS — Z0001 Encounter for general adult medical examination with abnormal findings: Secondary | ICD-10-CM | POA: Diagnosis not present

## 2023-10-27 DIAGNOSIS — Z125 Encounter for screening for malignant neoplasm of prostate: Secondary | ICD-10-CM | POA: Diagnosis not present

## 2023-10-27 DIAGNOSIS — Z1211 Encounter for screening for malignant neoplasm of colon: Secondary | ICD-10-CM

## 2023-10-27 DIAGNOSIS — Z0000 Encounter for general adult medical examination without abnormal findings: Secondary | ICD-10-CM

## 2023-10-27 LAB — LIPID PANEL

## 2023-10-27 MED ORDER — NADOLOL 40 MG PO TABS
40.0000 mg | ORAL_TABLET | Freq: Every day | ORAL | 3 refills | Status: AC
  Filled 2023-10-27 – 2024-01-07 (×2): qty 90, 90d supply, fill #0
  Filled 2024-04-08 – 2024-04-09 (×2): qty 90, 90d supply, fill #1

## 2023-10-27 MED ORDER — LOSARTAN POTASSIUM 50 MG PO TABS
50.0000 mg | ORAL_TABLET | Freq: Every day | ORAL | 3 refills | Status: AC
  Filled 2023-10-27 – 2024-01-07 (×2): qty 90, 90d supply, fill #0
  Filled 2024-04-08 – 2024-04-09 (×2): qty 90, 90d supply, fill #1

## 2023-10-27 MED ORDER — NADOLOL 40 MG PO TABS: 40.0000 mg | ORAL_TABLET | Freq: Every day | ORAL | 3 refills | Status: DC

## 2023-10-27 MED ORDER — LOSARTAN POTASSIUM 50 MG PO TABS: 50.0000 mg | ORAL_TABLET | Freq: Every day | ORAL | 3 refills | Status: DC

## 2023-10-27 NOTE — Progress Notes (Signed)
 BP 138/87   Pulse 64   Ht 5' 9 (1.753 m)   Wt 235 lb (106.6 kg)   SpO2 95%   BMI 34.70 kg/m    Subjective:   Patient ID: Michael Hays, male    DOB: 1970/10/12, 53 y.o.   MRN: [id number]  HPI: Michael Hays is a 54 y.o. male presenting on 10/27/2023 for Medical Management of Chronic Issues (CPE)   HPI Physical exam Patient denies any chest pain, shortness of breath, headaches or vision issues, abdominal complaints, diarrhea, nausea, vomiting.  Patient's biggest complaint is still his right knee and the swelling in that that has had surgeries on prior.  Hyperlipidemia and hypertriglyceridemia Patient is coming in for recheck of his hyperlipidemia. The patient is currently taking Repatha . They deny any issues with myalgias or history of liver damage from it. They deny any focal numbness or weakness or chest pain.   Hypertension Patient is currently on amlodipine  and losartan  and nadolol , and their blood pressure today is 138/87. Patient denies any lightheadedness or dizziness. Patient denies headaches, blurred vision, chest pains, shortness of breath, or weakness. Denies any side effects from medication and is content with current medication.   Gout Last attack: 4 to 5 months ago Attacks this year: 3 Medication: Colchicine  as needed and prednisone  as needed Location of attacks: Left foot and sometimes in his knees  Relevant past medical, surgical, family and social history reviewed and updated as indicated. Interim medical history since our last visit reviewed. Allergies and medications reviewed and updated.  Review of Systems  Constitutional:  Negative for chills and fever.  Eyes:  Negative for visual disturbance.  Respiratory:  Negative for shortness of breath and wheezing.   Cardiovascular:  Negative for chest pain and leg swelling.  Musculoskeletal:  Negative for back pain and gait problem.  Skin:  Negative for rash.  Neurological:  Negative for dizziness and  light-headedness.  All other systems reviewed and are negative.   Per HPI unless specifically indicated above   Allergies as of 10/27/2023       Reactions   Atenolol    Codeine    Hydrocodone-acetaminophen          Medication List        Accurate as of October 27, 2023  9:55 AM. If you have any questions, ask your nurse or doctor.          STOP taking these medications    sulfamethoxazole -trimethoprim  800-160 MG tablet Commonly known as: BACTRIM  DS Stopped by: Fonda DELENA Zai Chmiel       TAKE these medications    amLODipine  5 MG tablet Commonly known as: NORVASC  Take 1 tablet (5 mg total) by mouth 2 (two) times daily.   colchicine  0.6 MG tablet TAKE 1 TABLET BY MOUTH DAILY   ibuprofen 200 MG tablet Commonly known as: ADVIL Take 200 mg by mouth every 6 (six) hours as needed.   losartan  50 MG tablet Commonly known as: COZAAR  Take 1 tablet (50 mg total) by mouth daily.   nadolol  40 MG tablet Commonly known as: CORGARD  Take 1 tablet (40 mg total) by mouth daily.   predniSONE  20 MG tablet Commonly known as: DELTASONE  Take 3 tabs daily for 1 week, then 2 tabs daily for week 2, then 1 tab daily for week 3.   predniSONE  20 MG tablet Commonly known as: DELTASONE  2 po at same time daily for 5 days   Repatha  140 MG/ML Sosy Generic drug: Evolocumab  Inject  140 mg into the skin every 14 (fourteen) days.         Objective:   BP 138/87   Pulse 64   Ht 5' 9 (1.753 m)   Wt 235 lb (106.6 kg)   SpO2 95%   BMI 34.70 kg/m   Wt Readings from Last 3 Encounters:  10/27/23 235 lb (106.6 kg)  08/24/23 233 lb (105.7 kg)  08/03/23 231 lb (104.8 kg)    Physical Exam Vitals and nursing note reviewed.  Constitutional:      General: He is not in acute distress.    Appearance: He is well-developed. He is not diaphoretic.  Eyes:     General: No scleral icterus.    Conjunctiva/sclera: Conjunctivae normal.  Neck:     Thyroid : No thyromegaly.  Cardiovascular:      Rate and Rhythm: Normal rate and regular rhythm.     Heart sounds: Normal heart sounds. No murmur heard. Pulmonary:     Effort: Pulmonary effort is normal. No respiratory distress.     Breath sounds: Normal breath sounds. No wheezing.  Musculoskeletal:        General: No swelling. Normal range of motion.     Cervical back: Neck supple.  Lymphadenopathy:     Cervical: No cervical adenopathy.  Skin:    General: Skin is warm and dry.     Findings: No rash.  Neurological:     Mental Status: He is alert and oriented to person, place, and time.     Coordination: Coordination normal.  Psychiatric:        Behavior: Behavior normal.       Assessment & Plan:   Problem List Items Addressed This Visit       Cardiovascular and Mediastinum   Essential hypertension - Primary   Relevant Medications   losartan  (COZAAR ) 50 MG tablet   nadolol  (CORGARD ) 40 MG tablet   Other Relevant Orders   CBC with Differential/Platelet   CMP14+EGFR     Other   HLD (hyperlipidemia)   Relevant Medications   losartan  (COZAAR ) 50 MG tablet   nadolol  (CORGARD ) 40 MG tablet   Other Relevant Orders   Lipid panel   CBC with Differential/Platelet   CMP14+EGFR   Hypertriglyceridemia   Relevant Medications   losartan  (COZAAR ) 50 MG tablet   nadolol  (CORGARD ) 40 MG tablet   Other Relevant Orders   Lipid panel   CBC with Differential/Platelet   CMP14+EGFR   Gout   Relevant Orders   Uric acid   Other Visit Diagnoses       Colon cancer screening       Relevant Orders   Cologuard     Physical exam         Prostate cancer screening       Relevant Orders   PSA, total and free       Patient seems to be doing well for the most part minus knee issues, he is going to go back and talk with his Ortho about knee issues.  They are following along with that.  Will do blood work today, no other changes Follow up plan: Return in about 6 months (around 04/28/2024), or if symptoms worsen or fail to improve,  for Hyperlipidemia and hypertriglyceridemia and hypertension recheck.  Counseling provided for all of the vaccine components Orders Placed This Encounter  Procedures   Cologuard   Lipid panel   CBC with Differential/Platelet   CMP14+EGFR   PSA, total and free  Uric acid    Fonda Levins, MD [facility] 10/27/2023, 9:55 AM

## 2023-10-27 NOTE — Addendum Note (Signed)
 Addended by: LEIGH ROSINA SAILOR on: 10/27/2023 10:11 AM   Modules accepted: Orders

## 2023-10-28 LAB — CMP14+EGFR
ALT: 47 IU/L — AB (ref 0–44)
AST: 38 IU/L (ref 0–40)
Albumin: 4.4 g/dL (ref 3.8–4.9)
Alkaline Phosphatase: 89 IU/L (ref 44–121)
BUN/Creatinine Ratio: 19 (ref 9–20)
BUN: 15 mg/dL (ref 6–24)
Bilirubin Total: 0.6 mg/dL (ref 0.0–1.2)
CO2: 20 mmol/L (ref 20–29)
Calcium: 9.9 mg/dL (ref 8.7–10.2)
Chloride: 102 mmol/L (ref 96–106)
Creatinine, Ser: 0.79 mg/dL (ref 0.76–1.27)
Globulin, Total: 2.8 g/dL (ref 1.5–4.5)
Glucose: 107 mg/dL — AB (ref 70–99)
Potassium: 4.8 mmol/L (ref 3.5–5.2)
Sodium: 140 mmol/L (ref 134–144)
Total Protein: 7.2 g/dL (ref 6.0–8.5)
eGFR: 107 mL/min/1.73 (ref >59)

## 2023-10-28 LAB — CBC WITH DIFFERENTIAL/PLATELET
Basophils Absolute: 0.1 x10E3/uL (ref 0.0–0.2)
Basos: 2 %
EOS (ABSOLUTE): 0.2 x10E3/uL (ref 0.0–0.4)
Eos: 3 %
Hematocrit: 46.8 % (ref 37.5–51.0)
Hemoglobin: 15 g/dL (ref 13.0–17.7)
Immature Grans (Abs): 0 x10E3/uL (ref 0.0–0.1)
Immature Granulocytes: 0 %
Lymphocytes Absolute: 1.4 x10E3/uL (ref 0.7–3.1)
Lymphs: 26 %
MCH: 29.7 pg (ref 26.6–33.0)
MCHC: 32.1 g/dL (ref 31.5–35.7)
MCV: 93 fL (ref 79–97)
Monocytes Absolute: 0.7 x10E3/uL (ref 0.1–0.9)
Monocytes: 13 %
Neutrophils Absolute: 3 x10E3/uL (ref 1.4–7.0)
Neutrophils: 55 %
Platelets: 239 x10E3/uL (ref 150–450)
RBC: 5.05 x10E6/uL (ref 4.14–5.80)
RDW: 13.2 % (ref 11.6–15.4)
WBC: 5.4 x10E3/uL (ref 3.4–10.8)

## 2023-10-28 LAB — URIC ACID: Uric Acid: 9 mg/dL — AB (ref 3.8–8.4)

## 2023-10-28 LAB — LIPID PANEL
Cholesterol, Total: 197 mg/dL (ref 100–199)
HDL: 57 mg/dL (ref >39)
LDL CALC COMMENT:: 3.5 ratio (ref 0.0–5.0)
LDL Chol Calc (NIH): 114 mg/dL — AB (ref 0–99)
Triglycerides: 146 mg/dL (ref 0–149)
VLDL Cholesterol Cal: 26 mg/dL (ref 5–40)

## 2023-10-28 LAB — PSA, TOTAL AND FREE
PSA, Free Pct: 25
PSA, Free: 0.55 ng/mL
Prostate Specific Ag, Serum: 2.2 ng/mL (ref 0.0–4.0)

## 2023-11-04 ENCOUNTER — Other Ambulatory Visit: Payer: Self-pay

## 2023-11-04 ENCOUNTER — Other Ambulatory Visit (HOSPITAL_COMMUNITY): Payer: Self-pay

## 2023-11-04 ENCOUNTER — Other Ambulatory Visit (HOSPITAL_BASED_OUTPATIENT_CLINIC_OR_DEPARTMENT_OTHER): Payer: Self-pay

## 2023-11-04 ENCOUNTER — Ambulatory Visit: Payer: Self-pay | Admitting: Family Medicine

## 2023-11-04 MED ORDER — ALLOPURINOL 100 MG PO TABS
100.0000 mg | ORAL_TABLET | Freq: Every day | ORAL | 6 refills | Status: AC
  Filled 2023-11-04: qty 30, 30d supply, fill #0

## 2023-11-24 ENCOUNTER — Other Ambulatory Visit: Payer: Self-pay | Admitting: *Deleted

## 2023-11-24 DIAGNOSIS — M25572 Pain in left ankle and joints of left foot: Secondary | ICD-10-CM

## 2023-11-24 DIAGNOSIS — R600 Localized edema: Secondary | ICD-10-CM

## 2023-11-24 DIAGNOSIS — M10072 Idiopathic gout, left ankle and foot: Secondary | ICD-10-CM

## 2023-11-24 DIAGNOSIS — L03116 Cellulitis of left lower limb: Secondary | ICD-10-CM

## 2023-11-24 MED ORDER — PREDNISONE 20 MG PO TABS: ORAL_TABLET | ORAL | 0 refills | Status: DC

## 2023-11-24 NOTE — Telephone Encounter (Signed)
 Needing refill of Prednisone  d/t allergic reaction he had while at the beach recently to [facility]  Also needs Epi pen for future use, or will this be discussed at next visit

## 2024-01-07 ENCOUNTER — Other Ambulatory Visit: Payer: Self-pay | Admitting: Family Medicine

## 2024-01-07 ENCOUNTER — Other Ambulatory Visit (HOSPITAL_COMMUNITY): Payer: Self-pay

## 2024-01-07 DIAGNOSIS — M1A0720 Idiopathic chronic gout, left ankle and foot, without tophus (tophi): Secondary | ICD-10-CM

## 2024-01-09 ENCOUNTER — Other Ambulatory Visit: Payer: Self-pay

## 2024-01-13 ENCOUNTER — Telehealth: Payer: Self-pay | Admitting: Family Medicine

## 2024-01-13 ENCOUNTER — Other Ambulatory Visit: Payer: Self-pay

## 2024-01-13 DIAGNOSIS — R600 Localized edema: Secondary | ICD-10-CM

## 2024-01-13 DIAGNOSIS — M25572 Pain in left ankle and joints of left foot: Secondary | ICD-10-CM

## 2024-01-13 DIAGNOSIS — M10072 Idiopathic gout, left ankle and foot: Secondary | ICD-10-CM

## 2024-01-13 DIAGNOSIS — L03116 Cellulitis of left lower limb: Secondary | ICD-10-CM

## 2024-01-13 MED ORDER — PREDNISONE 20 MG PO TABS: ORAL_TABLET | ORAL | 0 refills | Status: DC

## 2024-01-13 NOTE — Telephone Encounter (Signed)
 Per Dr. Maryanne ok to refill 20mg  of Prednisone  #10 with 0 refills. Sent to [facility] in [place]. Wife made aware.

## 2024-01-13 NOTE — Telephone Encounter (Signed)
  Prescription Request  01/13/2024  Is this a Controlled Substance medicine? No   Have you seen your PCP in the last 2 weeks? No   If YES, route message to pool  -  If NO, patient needs to be scheduled for appointment.  What is the name of the medication or equipment? Prednisone   Have you contacted your pharmacy to request a refill? no   Which pharmacy would you like this sent to? [facility]   Patient notified that their request is being sent to the clinical staff for review and that they should receive a response within 2 business days.

## 2024-01-23 ENCOUNTER — Other Ambulatory Visit (HOSPITAL_COMMUNITY): Payer: Self-pay

## 2024-01-23 ENCOUNTER — Other Ambulatory Visit: Payer: Self-pay

## 2024-01-24 ENCOUNTER — Other Ambulatory Visit (HOSPITAL_COMMUNITY): Payer: Self-pay

## 2024-01-27 ENCOUNTER — Ambulatory Visit: Admitting: Family Medicine

## 2024-01-27 ENCOUNTER — Encounter: Payer: Self-pay | Admitting: Family Medicine

## 2024-01-27 VITALS — BP 143/81 | HR 75 | Ht 69.0 in | Wt 239.0 lb

## 2024-01-27 DIAGNOSIS — M109 Gout, unspecified: Secondary | ICD-10-CM | POA: Diagnosis not present

## 2024-01-27 MED ORDER — PREDNISONE 20 MG PO TABS: ORAL_TABLET | ORAL | 0 refills | Status: DC

## 2024-01-27 NOTE — Progress Notes (Signed)
 BP (!) 143/81   Pulse 75   Ht 5' 9 (1.753 m)   Wt 239 lb (108.4 kg)   SpO2 96%   BMI 35.29 kg/m    Subjective:   Patient ID: Michael Hays, male    DOB: 05/29/1970, 53 y.o.   MRN: [id number]  HPI: Michael Hays is a 53 y.o. male presenting on 01/27/2024 for Medical Management of Chronic Issues and Leg Pain (LLE )   Discussed the use of AI scribe software for clinical note transcription with the patient, who gave verbal consent to proceed.  History of Present Illness   Michael Hays is a 53 year old male with gout who presents with left leg pain.  Left lower extremity pain and swelling - Onset since Saturday following a day at the beach, after walking approximately five miles at a car show on Friday - Pain localized to the left leg, particularly around the knee - Described as tender and sore - Initially associated with redness and swelling - No specific injury, twist, or trauma to the leg - Pain worsens with inactivity and improves with movement - Pain has disrupted sleep for three days  Gout flares and management - History of gout, last flare in May - Not currently taking colchicine  or allopurinol  due to previous allergic reaction - Increased alcohol consumption and dietary changes during beach trip, which have been associated with prior gout flares  Analgesic and anti-inflammatory medication use - Ibuprofen and naproxen  taken on Sunday night alleviated pain by Monday, but pain recurred on Tuesday - Prednisone  6 mg taken on Sunday provided some relief; 40 mg taken on the morning of the visit is helping - Meloxicam  taken the previous night without significant relief - Currently using ibuprofen and Aleve  for pain management          Relevant past medical, surgical, family and social history reviewed and updated as indicated. Interim medical history since our last visit reviewed. Allergies and medications reviewed and updated.  Review of Systems  Constitutional:   Negative for chills and fever.  Eyes:  Negative for visual disturbance.  Respiratory:  Negative for shortness of breath and wheezing.   Cardiovascular:  Negative for chest pain and leg swelling.  Musculoskeletal:  Positive for arthralgias, gait problem and joint swelling. Negative for back pain.  Skin:  Negative for rash.  All other systems reviewed and are negative.   Per HPI unless specifically indicated above   Allergies as of 01/27/2024       Reactions   Atenolol    Codeine    Hydrocodone-acetaminophen          Medication List        Accurate as of January 27, 2024 11:07 AM. If you have any questions, ask your nurse or doctor.          allopurinol  100 MG tablet Commonly known as: ZYLOPRIM  Take 1 tablet (100 mg total) by mouth daily.   amLODipine  5 MG tablet Commonly known as: NORVASC  Take 1 tablet (5 mg total) by mouth 2 (two) times daily.   colchicine  0.6 MG tablet Take 1 tablet by mouth once daily   ibuprofen 200 MG tablet Commonly known as: ADVIL Take 200 mg by mouth every 6 (six) hours as needed.   losartan  50 MG tablet Commonly known as: COZAAR  Take 1 tablet (50 mg total) by mouth daily.   nadolol  40 MG tablet Commonly known as: CORGARD  Take 1 tablet (40 mg total) by mouth  daily.   predniSONE  20 MG tablet Commonly known as: DELTASONE  Take 3 tabs daily for 1 week, then 2 tabs daily for week 2, then 1 tab daily for week 3. What changed: additional instructions Changed by: Fonda LABOR Vernadine Coombs   Repatha  140 MG/ML Sosy Generic drug: Evolocumab  Inject 140 mg into the skin every 14 (fourteen) days.         Objective:   BP (!) 143/81   Pulse 75   Ht 5' 9 (1.753 m)   Wt 239 lb (108.4 kg)   SpO2 96%   BMI 35.29 kg/m   Wt Readings from Last 3 Encounters:  01/27/24 239 lb (108.4 kg)  10/27/23 235 lb (106.6 kg)  08/24/23 233 lb (105.7 kg)    Physical Exam Physical Exam   EXTREMITIES: Left leg not warmer than right leg. Tenderness in  left leg above knee.  And below the knee with slight swelling inflammation, no overlying erythema.        Assessment & Plan:   Problem List Items Addressed This Visit       Other   Gout - Primary   Relevant Medications   predniSONE  (DELTASONE ) 20 MG tablet       Gout flare, left lower leg Acute gout flare in left knee, responsive to prednisone , indicating gout rather than infection. Previous allopurinol  allergy noted. - Continue prednisone  for a few more days. - Send prednisone  refill. - Apply ice therapy to affected area. - Switch to ibuprofen or naproxen  post-prednisone  for pain management. - Monitor symptoms and report by Monday or Tuesday.          Follow up plan: Return if symptoms worsen or fail to improve.  Counseling provided for all of the vaccine components No orders of the defined types were placed in this encounter.   Fonda Levins, MD [facility] 01/27/2024, 11:07 AM

## 2024-02-21 ENCOUNTER — Other Ambulatory Visit: Payer: Self-pay

## 2024-03-22 ENCOUNTER — Encounter: Payer: Self-pay | Admitting: Family Medicine

## 2024-03-22 ENCOUNTER — Ambulatory Visit: Admitting: Family Medicine

## 2024-03-22 VITALS — BP 138/76 | HR 77 | Ht 69.0 in | Wt 244.0 lb

## 2024-03-22 DIAGNOSIS — M1A0220 Idiopathic chronic gout, left elbow, without tophus (tophi): Secondary | ICD-10-CM | POA: Diagnosis not present

## 2024-03-22 DIAGNOSIS — M7022 Olecranon bursitis, left elbow: Secondary | ICD-10-CM | POA: Diagnosis not present

## 2024-03-22 NOTE — Progress Notes (Signed)
 BP 138/76   Pulse 77   Ht 5' 9 (1.753 m)   Wt 244 lb (110.7 kg)   SpO2 97%   BMI 36.03 kg/m    Subjective:   Patient ID: Michael Hays, male    DOB: Dec 05, 1970, 53 y.o.   MRN: [id number]  HPI: Michael Hays is a 53 y.o. male presenting on 03/22/2024 for elbow nodule (left)   Discussed the use of AI scribe software for clinical note transcription with the patient, who gave verbal consent to proceed.  History of Present Illness   Michael Hays is a 53 year old male with gout who presents with elbow swelling and pain.  Elbow swelling and pain - Swelling in the elbow present for an extended period, became markedly hard and swollen two days ago - Swelling is currently minimally painful - Swelling has slightly decreased with two days of oral prednisone  - Swelling worsens with increased use of the elbow and heavy lifting at work - Swelling decreases with less use of the elbow - Elbow was recently described as 'hard as a rock' - Uses an elbow brace when driving long distances to prevent resting on car door, but finds it difficult to wear at work due to bulky clothing for cold weather  Gout and joint effusion history - History of gout - Previous joint effusion in the knee, which required drainage - Initial suspicion of malignancy with prior knee effusion, but malignancy was ruled out - Familiar with joint drainage procedures  Occupational and mechanical factors - Works 40 minutes from home - Attributes elbow symptoms in part to resting elbow on car door during commute and heavy lifting at work  Substance use - Uses THC gummies for stress relief and sleep as an alternative to alcohol - Gradually reducing alcohol intake          Relevant past medical, surgical, family and social history reviewed and updated as indicated. Interim medical history since our last visit reviewed. Allergies and medications reviewed and updated.  Review of Systems  Constitutional:  Negative for  chills and fever.  Respiratory:  Negative for shortness of breath and wheezing.   Cardiovascular:  Negative for chest pain and leg swelling.  Musculoskeletal:  Positive for arthralgias and joint swelling. Negative for back pain and gait problem.  Skin:  Negative for rash.  All other systems reviewed and are negative.   Per HPI unless specifically indicated above   Allergies as of 03/22/2024       Reactions   Atenolol    Codeine    Hydrocodone-acetaminophen          Medication List        Accurate as of March 22, 2024  9:37 AM. If you have any questions, ask your nurse or doctor.          allopurinol  100 MG tablet Commonly known as: ZYLOPRIM  Take 1 tablet (100 mg total) by mouth daily.   amLODipine  5 MG tablet Commonly known as: NORVASC  Take 1 tablet (5 mg total) by mouth 2 (two) times daily.   colchicine  0.6 MG tablet Take 1 tablet by mouth once daily   ibuprofen 200 MG tablet Commonly known as: ADVIL Take 200 mg by mouth every 6 (six) hours as needed.   losartan  50 MG tablet Commonly known as: COZAAR  Take 1 tablet (50 mg total) by mouth daily.   nadolol  40 MG tablet Commonly known as: CORGARD  Take 1 tablet (40 mg total) by mouth daily.  predniSONE  20 MG tablet Commonly known as: DELTASONE  Take 3 tabs daily for 1 week, then 2 tabs daily for week 2, then 1 tab daily for week 3.   Repatha  140 MG/ML Sosy Generic drug: Evolocumab  Inject 140 mg into the skin every 14 (fourteen) days.         Objective:   BP 138/76   Pulse 77   Ht 5' 9 (1.753 m)   Wt 244 lb (110.7 kg)   SpO2 97%   BMI 36.03 kg/m   Wt Readings from Last 3 Encounters:  03/22/24 244 lb (110.7 kg)  01/27/24 239 lb (108.4 kg)  10/27/23 235 lb (106.6 kg)    Physical Exam Physical Exam   MUSCULOSKELETAL: Right elbow swollen and warm.         Assessment & Plan:   Problem List Items Addressed This Visit       Other   Gout   Relevant Orders   Ambulatory referral to  Orthopedic Surgery   Other Visit Diagnoses       Olecranon bursitis of left elbow    -  Primary   Relevant Orders   Ambulatory referral to Orthopedic Surgery          Gout flare of the right elbow Chronic gout flare with swelling and warmth, likely exacerbated by activity and possible bursitis. Prefers oral prednisone  due to past migraines with injections. Considering orthopedics referral for drainage. - Continue oral prednisone  for three more days. - Referred to orthopedics for evaluation and potential drainage. - Advised use of ACE bandage or elbow brace. - Discussed dietary modifications to avoid red meats, caffeinated beverages, and alcohol.  Alcohol use reduction counseling Discussed reducing alcohol to manage gout and overall health. Considering THC gummies as an alternative for relaxation. Emphasized gradual reduction to avoid withdrawal. - Advised gradual reduction of alcohol intake. - Discussed potential use of THC gummies, ensuring reputable source and proper dosing.          Follow up plan: Return if symptoms worsen or fail to improve.  Counseling provided for all of the vaccine components Orders Placed This Encounter  Procedures   Ambulatory referral to Orthopedic Surgery    Fonda Levins, MD [facility] 03/22/2024, 9:37 AM

## 2024-03-28 DIAGNOSIS — M7022 Olecranon bursitis, left elbow: Secondary | ICD-10-CM | POA: Diagnosis not present

## 2024-03-30 ENCOUNTER — Encounter: Payer: Self-pay | Admitting: Nurse Practitioner

## 2024-03-30 ENCOUNTER — Ambulatory Visit: Admitting: Nurse Practitioner

## 2024-03-30 VITALS — BP 119/64 | HR 102 | Temp 97.1°F | Ht 69.0 in | Wt 244.0 lb

## 2024-03-30 DIAGNOSIS — L03114 Cellulitis of left upper limb: Secondary | ICD-10-CM

## 2024-03-30 MED ORDER — CEFTRIAXONE SODIUM 1 G IJ SOLR
1.0000 g | Freq: Once | INTRAMUSCULAR | Status: AC
  Administered 2024-03-30: 1 g via INTRAMUSCULAR

## 2024-03-30 NOTE — Patient Instructions (Signed)
 Wound Packing Wound packing usually involves placing a moistened packing material into your wound and then covering it with an outer bandage (dressing). This helps support the healing of deep tissue and tissue under the skin. It also helps prevent bleeding, infection, and further injury. Wounds are packed until deep tissues heal. The time it takes for this to happen is different for everyone. Your health care provider will show you how to pack and dress your wound. Using gloves and a clean technique is important to avoid spreading germs into your wound. Supplies needed: Soap and water. Disposable gloves. Cleansing or wetting solution, such as saline, germ-free (sterile) water, or an antiseptic solution. Clean bowl. Clean packing material, such as gauze, gauze sponges, or rolled gauze. Clean paper towels. Outer dressing. This includes the cover dressing and tape, or a dressing with an adhesive border. Cotton-tipped swabs. Small plastic bag for trash. How to pack your wound Follow your health care provider's instructions on how often you need to change dressings and pack your wound. You will likely be asked to change your dressings 1 to 2 times a day. Preparing to change the wound packing If needed, take pain medicine 30 minutes before you pack your wound as told by your health care provider. Preparing the new packing material  Clean and disinfect your work surface or countertop. Set a plastic bag on or near your work surface. Wash your hands with soap and water for at least 20 seconds before you change the dressing. If soap and water are not available, use hand sanitizer. Put a clean paper towel on the counter. Put a clean bowl on the towel. Only touch the outside of the bowl when handling it. Pour the cleansing or wetting solution that your health care provider tells you to use into the bowl. Select and cut your packing material to fit the size of your wound. Avoid using multiple pieces of  packing material. Drop it into the bowl. Cut tape strips that you will use to seal the outer dressing, if needed. Put gauze pads for cleansing and cotton-tipped swabs on the clean paper towel. Removing the old packing material and dressing Put on a set of gloves. Gently remove the old dressing and packing material. Make sure to check how the drainage looks or if there is any odor. Clean or rinse (irrigate) the wound. Remove your gloves. Put the removed items, including gloves, into the plastic bag to throw away later. Wash your hands again with soap and water for at least 20 seconds. If soap and water are not available, use hand sanitizer. Applying the new packing material and dressing  Put on a new set of gloves. Squeeze the packing material in the bowl to release the extra liquid. The packing material should be moist, but not dripping wet. Gently place the packing material into the wound. Use a cotton-tipped swab to guide it into place, filling all of the space. Do not overpack the wound bed. Dry your gloved fingertips on the paper towel. Open up your outer dressing supplies and put them on a dry part of the paper towel. Keep them from getting wet. Place the outer dressing over the packed wound. Tape the edges of the outer dressing in place. Remove your gloves. Wash your hands again with soap and water for at least 20 seconds. If soap and water are not available, use hand sanitizer. Put the removed items, including gloves, into the plastic bag to throw away. Clean and disinfect your work  surface or countertop. General tips Follow your health care provider's instructions on how much to pack the wound. At first, you may need to pack it more fully to help stop bleeding. As the wound begins to heal inside, you will use less packing material and pack the wound loosely to allow the tissue to heal slowly from the inside out. Do not take baths, swim, or use a hot tub until your health care  provider approves. Ask your health care provider if you may take showers. You may only be allowed to take sponge baths. Keep the dressing clean and dry. Follow any other instructions given by your health care provider on how to aid healing. This may include applying warm or cold compresses, raising (elevating) the affected area, or wearing a compression dressing. Check your wound site every day for signs of infection. Check for: More redness, swelling, or pain. More fluid or blood. Warmth or hardness (induration). Pus or a bad smell. Protect your wound from the sun when you are outside for the first 6 months, or for as long as told by your health care provider. Cover up the scar area or apply sunscreen that has an SPF of at least 30. Keep all follow-up visits. This is important. Contact a health care provider if: Your pain is not controlled with pain medicine. You have more drainage, redness, swelling, or pain at your wound site. You have new rash, warmth, or induration around the wound. You have a fever or chills. Your wound becomes larger or deeper. Get help right away if: The tissue inside your wound changes color from pink to white, yellow, or black. You notice a bad smell or pus coming from the wound site. You are having trouble packing your wound. Your wound is bleeding, and the bleeding does not stop with gentle pressure. These symptoms may represent a serious problem that is an emergency. Do not wait to see if the symptoms will go away. Get medical help right away. Call your local emergency services (911 in the [place] Do not drive yourself to the hospital. Summary Wound packing usually involves placing a moistened packing material into your wound and then covering it with an outer bandage (dressing). Follow your health care provider's instructions on how often you need to change dressings and pack your wound. You will likely be asked to change dressings 1 to 2 times a day. When  packing your wound, it is important to use gloves to avoid spreading germs into the wound. Check your wound site every day for signs of infection. This information is not intended to replace advice given to you by your health care provider. Make sure you discuss any questions you have with your health care provider. Document Revised: 07/29/2020 Document Reviewed: 07/29/2020 Elsevier Patient Education  2024 [employer].

## 2024-03-30 NOTE — Progress Notes (Addendum)
 "  Subjective:    Patient ID: Michael Hays, male    DOB: [birth year], 53 y.o.   MRN: [id number]   Chief Complaint: Elbow Pain (Swollen and draining/)   HPI  Patiet seen last week with inflammed elbow. Was referred to ortho. Bythe time he saw them Wednesday his elbow was infected. They did not want to drain it. They gave him doxycycline and told him to return if does not improve. The elbow started draining serosanginius fluid. He today for recheck. Patient Active Problem List   Diagnosis Date Noted   Hypertriglyceridemia 01/17/2023   Chronic pain of right knee 04/20/2017   Gout 11/10/2015   Transient synovitis of right knee 01/01/2015   LOC OSTEOARTHROS NOT SPEC PRIM/SEC LOWER LEG 01/29/2009   HLD (hyperlipidemia) 11/17/2006   Essential hypertension 11/17/2006   GERD 11/17/2006       Review of Systems  Constitutional:  Negative for diaphoresis.  Eyes:  Negative for pain.  Respiratory:  Negative for shortness of breath.   Cardiovascular:  Negative for chest pain, palpitations and leg swelling.  Gastrointestinal:  Negative for abdominal pain.  Endocrine: Negative for polydipsia.  Skin:  Negative for rash.  Neurological:  Negative for dizziness, weakness and headaches.  Hematological:  Does not bruise/bleed easily.  All other systems reviewed and are negative.      Objective:   Physical Exam Constitutional:      Appearance: Normal appearance.  Cardiovascular:     Rate and Rhythm: Normal rate and regular rhythm.     Heart sounds: Normal heart sounds.  Pulmonary:     Effort: Pulmonary effort is normal.     Breath sounds: Normal breath sounds.  Skin:    General: Skin is warm.  Neurological:     General: No focal deficit present.     Mental Status: He is alert and oriented to person, place, and time.  Psychiatric:        Mood and Affect: Mood normal.        Behavior: Behavior normal.    BP (!) 151/78   Pulse (!) 102   Temp (!) 97.1 F (36.2 C) (Temporal)   Ht 5'  9 (1.753 m)   Wt 244 lb (110.7 kg)   SpO2 94%   BMI 36.03 kg/m   I & D  Date/Time: 03/30/2024 12:21 PM  Performed by: Gladis Mustard, FNP Authorized by: Gladis Mustard, FNP   Consent:    Consent obtained:  Verbal   Consent given by:  Parent   Risks, benefits, and alternatives were discussed: yes     Risks discussed:  Incomplete drainage and infection Location:    Type:  Abscess   Size:  10cm   Location:  Upper extremity   Upper extremity location:  Elbow   Elbow location:  L elbow Pre-procedure details:    Skin preparation:  Povidone-iodine Sedation:    Sedation type:  None Anesthesia:    Anesthesia method:  None Procedure type:    Complexity:  Simple Procedure details:    Needle aspiration: no     Wound management:  Probed and deloculated and extensive cleaning   Drainage:  Serosanguinous   Drainage amount:  Moderate   Wound treatment:  Wound left open   Packing materials:  1/2 in iodoform gauze Post-procedure details:    Procedure completion:  Tolerated well, no immediate complications         Assessment & Plan:   Michael Hays in today with chief complaint of  Elbow Pain (Swollen and draining/)   1. Cellulitis of left elbow (Primary) Continue doxycycline Leave packing in until sees ortho next week. Keep clean and dry RTO if needed before sees ortho - cefTRIAXone  (ROCEPHIN ) injection 1 g    The above assessment and management plan was discussed with the patient. The patient verbalized understanding of and has agreed to the management plan. Patient is aware to call the clinic if symptoms persist or worsen. Patient is aware when to return to the clinic for a follow-up visit. Patient educated on when it is appropriate to go to the emergency department.   Mary-Margaret Gladis, FNP   "

## 2024-04-03 DIAGNOSIS — M7022 Olecranon bursitis, left elbow: Secondary | ICD-10-CM | POA: Diagnosis not present

## 2024-04-08 ENCOUNTER — Other Ambulatory Visit (HOSPITAL_COMMUNITY): Payer: Self-pay

## 2024-04-09 ENCOUNTER — Other Ambulatory Visit (HOSPITAL_COMMUNITY): Payer: Self-pay

## 2024-04-09 ENCOUNTER — Other Ambulatory Visit: Payer: Self-pay

## 2024-04-10 ENCOUNTER — Other Ambulatory Visit (HOSPITAL_COMMUNITY): Payer: Self-pay

## 2024-04-30 ENCOUNTER — Other Ambulatory Visit (HOSPITAL_COMMUNITY): Payer: Self-pay

## 2024-05-01 ENCOUNTER — Other Ambulatory Visit: Payer: Self-pay

## 2024-05-02 ENCOUNTER — Other Ambulatory Visit: Payer: Self-pay | Admitting: Family Medicine

## 2024-05-02 ENCOUNTER — Ambulatory Visit: Payer: Self-pay | Admitting: Family Medicine

## 2024-05-02 ENCOUNTER — Other Ambulatory Visit: Payer: Self-pay

## 2024-05-02 ENCOUNTER — Encounter: Payer: Self-pay | Admitting: Family Medicine

## 2024-05-02 ENCOUNTER — Other Ambulatory Visit (HOSPITAL_COMMUNITY): Payer: Self-pay

## 2024-05-02 VITALS — BP 136/77 | HR 66 | Ht 69.0 in | Wt 240.0 lb

## 2024-05-02 DIAGNOSIS — E782 Mixed hyperlipidemia: Secondary | ICD-10-CM | POA: Diagnosis not present

## 2024-05-02 DIAGNOSIS — I10 Essential (primary) hypertension: Secondary | ICD-10-CM | POA: Diagnosis not present

## 2024-05-02 DIAGNOSIS — Z1211 Encounter for screening for malignant neoplasm of colon: Secondary | ICD-10-CM

## 2024-05-02 DIAGNOSIS — E781 Pure hyperglyceridemia: Secondary | ICD-10-CM

## 2024-05-02 DIAGNOSIS — M10072 Idiopathic gout, left ankle and foot: Secondary | ICD-10-CM | POA: Diagnosis not present

## 2024-05-02 DIAGNOSIS — M7022 Olecranon bursitis, left elbow: Secondary | ICD-10-CM

## 2024-05-02 MED ORDER — REPATHA SURECLICK 140 MG/ML ~~LOC~~ SOAJ
140.0000 mg | SUBCUTANEOUS | 2 refills | Status: AC
  Filled 2024-05-02: qty 6, 84d supply, fill #0

## 2024-05-02 NOTE — Progress Notes (Signed)
 Sent refill for repatha 

## 2024-05-02 NOTE — Progress Notes (Signed)
 "  BP 136/77   Pulse 66   Ht 5' 9 (1.753 m)   Wt 240 lb (108.9 kg)   SpO2 96%   BMI 35.44 kg/m    Subjective:   Patient ID: Michael Hays, male    DOB: [birth year], 54 y.o.   MRN: [id number]  HPI: Michael Hays is a 54 y.o. male presenting on 05/02/2024 for Medical Management of Chronic Issues, Hyperlipidemia, and Hypertension   Discussed the use of AI scribe software for clinical note transcription with the patient, who gave verbal consent to proceed.  History of Present Illness   Michael Hays is a 54 year old male who presents for a recheck of a draining elbow lesion.  Elbow lesion - Lesion on elbow drained spontaneously on Christmas morning and has drained a total of three times - Previously evaluated at urgent care by a physician assistant - Currently not draining but still contains some fluid - Uses bandages to prevent irritation from rubbing  Bilateral knee pain and swelling - Bilateral knee pain, particularly posteriorly - Pain onset is sudden, occurring overnight - Pain is attributed to weather changes - Monitors swelling by noting when his watch becomes tight - No kidney pain  Hypertension management - Continues amlodipine , losartan , and nadolol  for blood pressure control  Hyperlipidemia management - Takes Repatha  for cholesterol management  Gout management - History of gout - Not currently taking allopurinol  due to allergic reaction - Uses colchicine  and prednisone  as needed - Manages well with naproxen  500 mg taken with food every other night          Relevant past medical, surgical, family and social history reviewed and updated as indicated. Interim medical history since our last visit reviewed. Allergies and medications reviewed and updated.  Review of Systems  Constitutional:  Negative for chills and fever.  Eyes:  Negative for visual disturbance.  Respiratory:  Negative for shortness of breath and wheezing.   Cardiovascular:  Negative for  chest pain and leg swelling.  Musculoskeletal:  Positive for arthralgias and joint swelling. Negative for back pain and gait problem.  Skin:  Negative for rash.  All other systems reviewed and are negative.   Per HPI unless specifically indicated above   Allergies as of 05/02/2024       Reactions   Atenolol    Codeine    Hydrocodone-acetaminophen          Medication List        Accurate as of May 02, 2024  8:27 AM. If you have any questions, ask your nurse or doctor.          STOP taking these medications    doxycycline 100 MG capsule Commonly known as: VIBRAMYCIN Stopped by: Fonda Levins, MD   Repatha  140 MG/ML Sosy Generic drug: Evolocumab  Replaced by: Repatha  SureClick 140 MG/ML Soaj Stopped by: Fonda Levins, MD       TAKE these medications    allopurinol  100 MG tablet Commonly known as: ZYLOPRIM  Take 1 tablet (100 mg total) by mouth daily.   amLODipine  5 MG tablet Commonly known as: NORVASC  Take 1 tablet (5 mg total) by mouth 2 (two) times daily.   colchicine  0.6 MG tablet Take 1 tablet by mouth once daily   ibuprofen 200 MG tablet Commonly known as: ADVIL Take 200 mg by mouth every 6 (six) hours as needed.   losartan  50 MG tablet Commonly known as: COZAAR  Take 1 tablet (50 mg total) by mouth daily.  nadolol  40 MG tablet Commonly known as: CORGARD  Take 1 tablet (40 mg total) by mouth daily.   Repatha  SureClick 140 MG/ML Soaj Generic drug: Evolocumab  Inject 140 mg into the skin every 14 (fourteen) days. Replaces: Repatha  140 MG/ML Sosy Started by: Fonda Levins, MD         Objective:   BP 136/77   Pulse 66   Ht 5' 9 (1.753 m)   Wt 240 lb (108.9 kg)   SpO2 96%   BMI 35.44 kg/m   Wt Readings from Last 3 Encounters:  05/02/24 240 lb (108.9 kg)  03/30/24 244 lb (110.7 kg)  03/22/24 244 lb (110.7 kg)    Physical Exam Vitals and nursing note reviewed.  Constitutional:      General: He is not in acute  distress.    Appearance: He is well-developed. He is not diaphoretic.  Eyes:     General: No scleral icterus.       Right eye: No discharge.     Conjunctiva/sclera: Conjunctivae normal.  Neck:     Thyroid : No thyromegaly.  Cardiovascular:     Rate and Rhythm: Normal rate and regular rhythm.     Heart sounds: Normal heart sounds. No murmur heard. Pulmonary:     Effort: Pulmonary effort is normal. No respiratory distress.     Breath sounds: Normal breath sounds. No wheezing.  Musculoskeletal:        General: Normal range of motion.     Cervical back: Neck supple.  Lymphadenopathy:     Cervical: No cervical adenopathy.  Skin:    General: Skin is warm and dry.     Findings: Erythema (Swollen and slightly erythematous on the skin olecranon bursa.  Has been seen orthopedic for this and did not drain and patient says it is feeling better.) and lesion present.  Neurological:     Mental Status: He is alert and oriented to person, place, and time.     Coordination: Coordination normal.  Psychiatric:        Behavior: Behavior normal.    Physical Exam   VITALS: BP- 136/77 NECK: Thyroid  normal. CHEST: Lungs clear to auscultation bilaterally. CARDIOVASCULAR: Heart regular rate and rhythm, no murmurs.         Assessment & Plan:   Problem List Items Addressed This Visit       Cardiovascular and Mediastinum   Essential hypertension   Relevant Orders   CBC with Differential/Platelet   CMP14+EGFR   Lipid panel     Other   HLD (hyperlipidemia) - Primary   Relevant Orders   CBC with Differential/Platelet   CMP14+EGFR   Lipid panel   Hypertriglyceridemia   Relevant Orders   CBC with Differential/Platelet   CMP14+EGFR   Lipid panel   Gout   Relevant Orders   Uric Acid   Other Visit Diagnoses       Colon cancer screening       Relevant Orders   Cologuard     Olecranon bursitis of left elbow             Olecranon bursitis, right elbow Chronic with recurrent  drainage. Orthopedic advised against surgery due to recurrence risk. Prefers conservative management. - Monitor for worsening inflammation or drainage. - Consider second opinion if symptoms worsen.  Essential hypertension Blood pressure well-controlled with current medications. - Continue amlodipine , losartan , and nadolol .  Mixed hyperlipidemia Managed with Repatha . New SureClick pen expected to be user-friendly. - Continue Repatha  with SureClick pen.  Idiopathic gout,  left ankle and foot Well-controlled. Allopurinol  discontinued due to allergy. Colchicine  and prednisone  used as needed. Naproxen  for pain. - Continue colchicine  and prednisone  as needed. - Use naproxen  500 mg with food for pain.          Follow up plan: Return in about 6 months (around 10/30/2024), or if symptoms worsen or fail to improve, for Physical exam and hypertension and hyperlipidemia.  Counseling provided for all of the vaccine components Orders Placed This Encounter  Procedures   CBC with Differential/Platelet   CMP14+EGFR   Lipid panel   Uric Acid   Cologuard    Fonda Levins, MD [facility] 05/02/2024, 8:27 AM     "

## 2024-05-11 ENCOUNTER — Other Ambulatory Visit: Payer: Self-pay

## 2024-05-17 ENCOUNTER — Other Ambulatory Visit

## 2024-11-22 ENCOUNTER — Encounter: Admitting: Family Medicine
